# Patient Record
Sex: Female | Born: 1995
Health system: Southern US, Community
[De-identification: ages and names within clinical notes are randomized; demographics above are authoritative.]

## PROBLEM LIST (undated history)

## (undated) HISTORY — PX: OTHER SURGICAL HISTORY: SHX169

---

## 2016-03-07 DIAGNOSIS — Z13220 Encounter for screening for lipoid disorders: Secondary | ICD-10-CM | POA: Diagnosis not present

## 2016-03-07 DIAGNOSIS — Z124 Encounter for screening for malignant neoplasm of cervix: Secondary | ICD-10-CM | POA: Diagnosis not present

## 2016-03-07 DIAGNOSIS — Z0000 Encounter for general adult medical examination without abnormal findings: Secondary | ICD-10-CM | POA: Diagnosis not present

## 2016-03-07 DIAGNOSIS — Z30011 Encounter for initial prescription of contraceptive pills: Secondary | ICD-10-CM | POA: Diagnosis not present

## 2016-03-07 LAB — HM PAP SMEAR: HM Pap smear: NEGATIVE

## 2016-05-01 DIAGNOSIS — J1089 Influenza due to other identified influenza virus with other manifestations: Secondary | ICD-10-CM | POA: Diagnosis not present

## 2017-01-15 DIAGNOSIS — L02426 Furuncle of left lower limb: Secondary | ICD-10-CM | POA: Diagnosis not present

## 2017-01-15 DIAGNOSIS — L02416 Cutaneous abscess of left lower limb: Secondary | ICD-10-CM | POA: Diagnosis not present

## 2017-02-19 DIAGNOSIS — J029 Acute pharyngitis, unspecified: Secondary | ICD-10-CM | POA: Diagnosis not present

## 2017-02-19 DIAGNOSIS — J020 Streptococcal pharyngitis: Secondary | ICD-10-CM | POA: Diagnosis not present

## 2017-02-19 DIAGNOSIS — R509 Fever, unspecified: Secondary | ICD-10-CM | POA: Diagnosis not present

## 2017-03-09 ENCOUNTER — Other Ambulatory Visit: Payer: Self-pay

## 2017-03-09 DIAGNOSIS — L0202 Furuncle of face: Secondary | ICD-10-CM | POA: Diagnosis present

## 2017-03-09 DIAGNOSIS — L03211 Cellulitis of face: Principal | ICD-10-CM | POA: Diagnosis present

## 2017-03-09 DIAGNOSIS — Z8614 Personal history of Methicillin resistant Staphylococcus aureus infection: Secondary | ICD-10-CM | POA: Diagnosis not present

## 2017-03-09 DIAGNOSIS — R51 Headache: Secondary | ICD-10-CM | POA: Diagnosis not present

## 2017-03-09 DIAGNOSIS — L03811 Cellulitis of head [any part, except face]: Secondary | ICD-10-CM | POA: Diagnosis not present

## 2017-03-09 DIAGNOSIS — E876 Hypokalemia: Secondary | ICD-10-CM | POA: Diagnosis present

## 2017-03-09 DIAGNOSIS — Z881 Allergy status to other antibiotic agents status: Secondary | ICD-10-CM

## 2017-03-09 NOTE — ED Triage Notes (Signed)
Pt arrives to ED via POV from home with c/o recurrent MRSA infections. Pt was seen at [facility] earlier today, r/x'd ABX (Clindamycin 300mg  TID). Pt reports taking 3 doses total and came in d/t no improvement in s/x's.

## 2017-03-10 ENCOUNTER — Inpatient Hospital Stay
Admission: EM | Admit: 2017-03-10 | Discharge: 2017-03-11 | DRG: 603 | Disposition: A | Discharge status: Home / Self Care | Payer: BLUE CROSS/BLUE SHIELD | Attending: Internal Medicine | Admitting: Internal Medicine

## 2017-03-10 ENCOUNTER — Other Ambulatory Visit: Payer: Self-pay

## 2017-03-10 ENCOUNTER — Encounter: Payer: Self-pay | Admitting: Radiology

## 2017-03-10 ENCOUNTER — Emergency Department: Payer: BLUE CROSS/BLUE SHIELD

## 2017-03-10 DIAGNOSIS — L03211 Cellulitis of face: Secondary | ICD-10-CM | POA: Diagnosis present

## 2017-03-10 DIAGNOSIS — L03811 Cellulitis of head [any part, except face]: Secondary | ICD-10-CM | POA: Diagnosis not present

## 2017-03-10 DIAGNOSIS — Z881 Allergy status to other antibiotic agents status: Secondary | ICD-10-CM | POA: Diagnosis not present

## 2017-03-10 DIAGNOSIS — E876 Hypokalemia: Secondary | ICD-10-CM | POA: Diagnosis not present

## 2017-03-10 DIAGNOSIS — R51 Headache: Secondary | ICD-10-CM | POA: Diagnosis not present

## 2017-03-10 DIAGNOSIS — L0202 Furuncle of face: Secondary | ICD-10-CM | POA: Diagnosis present

## 2017-03-10 DIAGNOSIS — Z8614 Personal history of Methicillin resistant Staphylococcus aureus infection: Secondary | ICD-10-CM | POA: Diagnosis not present

## 2017-03-10 HISTORY — DX: Cellulitis of face: L03.211

## 2017-03-10 LAB — CBC WITH DIFFERENTIAL/PLATELET
BASOS ABS: 0.1 10*3/uL (ref 0–0.1)
BASOS PCT: 1 %
EOS PCT: 2 %
Eosinophils Absolute: 0.1 10*3/uL (ref 0–0.7)
HCT: 40.2 % (ref 35.0–47.0)
Hemoglobin: 13.6 g/dL (ref 12.0–16.0)
Lymphocytes Relative: 24 %
Lymphs Abs: 2.3 10*3/uL (ref 1.0–3.6)
MCH: 27.5 pg (ref 26.0–34.0)
MCHC: 33.7 g/dL (ref 32.0–36.0)
MCV: 81.5 fL (ref 80.0–100.0)
MONO ABS: 0.7 10*3/uL (ref 0.2–0.9)
MONOS PCT: 8 %
Neutro Abs: 6.2 10*3/uL (ref 1.4–6.5)
Neutrophils Relative %: 65 %
PLATELETS: 254 10*3/uL (ref 150–440)
RBC: 4.94 MIL/uL (ref 3.80–5.20)
RDW: 13.4 % (ref 11.5–14.5)
WBC: 9.5 10*3/uL (ref 3.6–11.0)

## 2017-03-10 LAB — CBC
HEMATOCRIT: 39.2 % (ref 35.0–47.0)
Hemoglobin: 13.2 g/dL (ref 12.0–16.0)
MCH: 27.6 pg (ref 26.0–34.0)
MCHC: 33.7 g/dL (ref 32.0–36.0)
MCV: 82.1 fL (ref 80.0–100.0)
PLATELETS: 234 10*3/uL (ref 150–440)
RBC: 4.77 MIL/uL (ref 3.80–5.20)
RDW: 13.5 % (ref 11.5–14.5)
WBC: 6.2 10*3/uL (ref 3.6–11.0)

## 2017-03-10 LAB — BASIC METABOLIC PANEL
ANION GAP: 10 (ref 5–15)
Anion gap: 5 (ref 5–15)
BUN: 11 mg/dL (ref 6–20)
BUN: 8 mg/dL (ref 6–20)
CALCIUM: 9 mg/dL (ref 8.9–10.3)
CHLORIDE: 109 mmol/L (ref 101–111)
CO2: 22 mmol/L (ref 22–32)
CO2: 23 mmol/L (ref 22–32)
CREATININE: 0.56 mg/dL (ref 0.44–1.00)
CREATININE: 0.55 mg/dL (ref 0.44–1.00)
Calcium: 8.5 mg/dL — ABNORMAL LOW (ref 8.9–10.3)
Chloride: 105 mmol/L (ref 101–111)
GFR calc Af Amer: >60 mL/min (ref >60)
GFR calc non Af Amer: >60 mL/min (ref >60)
GLUCOSE: 92 mg/dL (ref 65–99)
GLUCOSE: 94 mg/dL (ref 65–99)
POTASSIUM: 3.5 mmol/L (ref 3.5–5.1)
Potassium: 3.4 mmol/L — ABNORMAL LOW (ref 3.5–5.1)
SODIUM: 137 mmol/L (ref 135–145)
Sodium: 137 mmol/L (ref 135–145)

## 2017-03-10 MED ORDER — HYDROCODONE-ACETAMINOPHEN 5-325 MG PO TABS: 1.0000 | ORAL_TABLET | ORAL | Status: DC | PRN

## 2017-03-10 MED ORDER — SODIUM CHLORIDE 0.9 % IV BOLUS (SEPSIS)
1000.0000 mL | Freq: Once | INTRAVENOUS | Status: AC
  Administered 2017-03-10: 1000 mL via INTRAVENOUS

## 2017-03-10 MED ORDER — POTASSIUM CHLORIDE CRYS ER 20 MEQ PO TBCR
20.0000 meq | EXTENDED_RELEASE_TABLET | Freq: Once | ORAL | Status: AC
  Administered 2017-03-10: 20 meq via ORAL

## 2017-03-10 MED ORDER — VANCOMYCIN HCL IN DEXTROSE 750-5 MG/150ML-% IV SOLN
750.0000 mg | Freq: Three times a day (TID) | INTRAVENOUS | Status: DC
  Filled 2017-03-10 (×2): qty 150

## 2017-03-10 MED ORDER — VANCOMYCIN HCL IN DEXTROSE 1-5 GM/200ML-% IV SOLN
1000.0000 mg | Freq: Once | INTRAVENOUS | Status: AC
  Administered 2017-03-10: 1000 mg via INTRAVENOUS
  Filled 2017-03-10: qty 200

## 2017-03-10 MED ORDER — POTASSIUM CHLORIDE CRYS ER 20 MEQ PO TBCR
EXTENDED_RELEASE_TABLET | ORAL | Status: AC
  Filled 2017-03-10: qty 1

## 2017-03-10 MED ORDER — ACETAMINOPHEN 650 MG RE SUPP: 650.0000 mg | Freq: Four times a day (QID) | RECTAL | Status: DC | PRN

## 2017-03-10 MED ORDER — ONDANSETRON HCL 4 MG PO TABS: 4.0000 mg | ORAL_TABLET | Freq: Four times a day (QID) | ORAL | Status: DC | PRN

## 2017-03-10 MED ORDER — DIPHENHYDRAMINE HCL 50 MG/ML IJ SOLN
12.5000 mg | Freq: Once | INTRAMUSCULAR | Status: AC
  Administered 2017-03-10: 12.5 mg via INTRAVENOUS
  Filled 2017-03-10: qty 1

## 2017-03-10 MED ORDER — SENNOSIDES-DOCUSATE SODIUM 8.6-50 MG PO TABS: 1.0000 | ORAL_TABLET | Freq: Every evening | ORAL | Status: DC | PRN

## 2017-03-10 MED ORDER — VANCOMYCIN HCL IN DEXTROSE 750-5 MG/150ML-% IV SOLN
750.0000 mg | Freq: Two times a day (BID) | INTRAVENOUS | Status: DC
  Administered 2017-03-10: 750 mg via INTRAVENOUS
  Filled 2017-03-10 (×3): qty 150

## 2017-03-10 MED ORDER — VANCOMYCIN HCL IN DEXTROSE 750-5 MG/150ML-% IV SOLN
750.0000 mg | Freq: Three times a day (TID) | INTRAVENOUS | Status: DC
  Filled 2017-03-10: qty 150

## 2017-03-10 MED ORDER — DIPHENHYDRAMINE HCL 25 MG PO CAPS
25.0000 mg | ORAL_CAPSULE | Freq: Once | ORAL | Status: AC
  Administered 2017-03-10: 25 mg via ORAL
  Filled 2017-03-10: qty 1

## 2017-03-10 MED ORDER — ACETAMINOPHEN 325 MG PO TABS: 650.0000 mg | ORAL_TABLET | Freq: Four times a day (QID) | ORAL | Status: DC | PRN

## 2017-03-10 MED ORDER — SODIUM CHLORIDE 0.9 % IV SOLN
INTRAVENOUS | Status: DC
Start: 2017-03-10 — End: 2017-03-11
  Administered 2017-03-10 – 2017-03-11 (×3): via INTRAVENOUS

## 2017-03-10 MED ORDER — ONDANSETRON HCL 4 MG/2ML IJ SOLN: 4.0000 mg | Freq: Four times a day (QID) | INTRAMUSCULAR | Status: DC | PRN

## 2017-03-10 MED ORDER — POTASSIUM CHLORIDE CRYS ER 20 MEQ PO TBCR
EXTENDED_RELEASE_TABLET | ORAL | Status: AC
  Administered 2017-03-10: 20 meq via ORAL
  Filled 2017-03-10: qty 1

## 2017-03-10 MED ORDER — KETOROLAC TROMETHAMINE 30 MG/ML IJ SOLN: 30.0000 mg | Freq: Four times a day (QID) | INTRAMUSCULAR | Status: DC | PRN

## 2017-03-10 MED ORDER — DIPHENHYDRAMINE HCL 25 MG PO CAPS
25.0000 mg | ORAL_CAPSULE | Freq: Four times a day (QID) | ORAL | Status: DC | PRN
  Administered 2017-03-10 (×3): 25 mg via ORAL
  Filled 2017-03-10 (×3): qty 1

## 2017-03-10 MED ORDER — IOPAMIDOL (ISOVUE-300) INJECTION 61%
75.0000 mL | Freq: Once | INTRAVENOUS | Status: AC | PRN
  Administered 2017-03-10: 75 mL via INTRAVENOUS

## 2017-03-10 NOTE — Progress Notes (Signed)
Dr Katheren ShamsSalary came to see and examine pt. Resume Vancomycin dose for tonight and give another dose of Benadryl 25mg  PO as premedication per Dr Katheren ShamsSalary. Pt and significant other at bedside agreeable to treatment.

## 2017-03-10 NOTE — H&P (Signed)
[facility] [place] [facility]   PATIENT NAME: Cassidy Boyd    MR#:  [id number]  DATE OF BIRTH:  08/28/1995  DATE OF ADMISSION:  03/10/2017  PRIMARY CARE PHYSICIAN: Patient, No Pcp Per   REQUESTING/REFERRING PHYSICIAN:   CHIEF COMPLAINT:   Chief Complaint  Patient presents with  . Recurrent Skin Infections    HISTORY OF PRESENT ILLNESS: Cassidy Dickernna Grisso  is a 21 y.o. female with no significant past medical history presented to the emergency room with redness and swelling over the forehead. Patient had a pimple over the forehead and she squeezed it. The redness surrounding the pimple area increased in size along with swelling and induration. Patient had some aching pain which was 5 out of 10 on a scale of 1-10. She went to urgent care clinic for the above and started taking oral clindamycin. Patient completed 3 days of antibiotic. And redness increased in size. Patient was treated for MRSA skin infections in the past. She presented to the emergency room with above complaints. No fever and chills. No itching or any drainage from the lesions. She was worked up with CT maxillofacial area which showed frontal cellulitis.  PAST MEDICAL HISTORY:  History reviewed. No pertinent past medical history.  PAST SURGICAL HISTORY:  Past Surgical History:  Procedure Laterality Date  . none      SOCIAL HISTORY:  Social History   Tobacco Use  . Smoking status: Never Smoker  . Smokeless tobacco: Never Used  Substance Use Topics  . Alcohol use: Yes    FAMILY HISTORY:  Family History  Problem Relation Age of Onset  . Diabetes Mellitus II Maternal Grandmother   . Diabetes Mellitus II Maternal Grandfather     DRUG ALLERGIES:  Allergies  Allergen Reactions  . Bactrim [Sulfamethoxazole-Trimethoprim] Rash    REVIEW OF SYSTEMS:   CONSTITUTIONAL: No fever, fatigue or weakness.  EYES: No blurred or double vision.  EARS, NOSE, AND THROAT: No tinnitus or ear pain.   RESPIRATORY: No cough, shortness of breath, wheezing or hemoptysis.  CARDIOVASCULAR: No chest pain, orthopnea, edema.  GASTROINTESTINAL: No nausea, vomiting, diarrhea or abdominal pain.  GENITOURINARY: No dysuria, hematuria.  ENDOCRINE: No polyuria, nocturia,  HEMATOLOGY: No anemia, easy bruising or bleeding SKIN: Has redness of skin over fore head with swelling and induration MUSCULOSKELETAL: No joint pain or arthritis.   NEUROLOGIC: No tingling, numbness, weakness.  PSYCHIATRY: No anxiety or depression.   MEDICATIONS AT HOME:  Prior to Admission medications   Medication Sig Start Date End Date Taking? Authorizing Provider  clindamycin (CLEOCIN) 300 MG capsule Take 1 capsule by mouth 3 (three) times daily. 03/09/17 03/19/17 Yes [provider]  mupirocin nasal ointment (BACTROBAN NASAL) 2 % Place 1 application into the nose 2 (two) times daily. 03/09/17 03/16/17 Yes [provider]  Norgestimate-Ethinyl Estradiol Triphasic (TRI-SPRINTEC) 0.18/0.215/0.25 MG-35 MCG tablet Take 1 tablet by mouth daily. 02/11/17  Yes [provider]      PHYSICAL EXAMINATION:   VITAL SIGNS: Blood pressure 130/81, pulse 91, temperature 97.9 F (36.6 C), temperature source Oral, resp. rate 17, height 5\' 6"  (1.676 m), weight 52.2 kg (115 lb), last menstrual period 03/09/2017, SpO2 100 %.  GENERAL:  21 y.o.-year-old patient lying in the bed with no acute distress.  EYES: Pupils equal, round, reactive to light and accommodation. No scleral icterus. Extraocular muscles intact.  HEENT: Head atraumatic, normocephalic. Oropharynx and nasopharynx clear.  Swelling and redness over fore head at the superior nasal bridge.  NECK:  Supple, no jugular venous distention. No thyroid enlargement, no tenderness.  LUNGS: Normal breath sounds bilaterally, no wheezing, rales,rhonchi or crepitation. No use of accessory muscles of respiration.  CARDIOVASCULAR: S1, S2 normal. No murmurs, rubs, or gallops.   ABDOMEN: Soft, nontender, nondistended. Bowel sounds present. No organomegaly or mass.  EXTREMITIES: No pedal edema, cyanosis, or clubbing.  NEUROLOGIC: Cranial nerves II through XII are intact. Muscle strength 5/5 in all extremities. Sensation intact. Gait not checked.  PSYCHIATRIC: The patient is alert and oriented x 3.  SKIN: Redness of skin with swelling over fore head.  LABORATORY PANEL:   CBC Recent Labs  Lab 03/10/17 0254  WBC 9.5  HGB 13.6  HCT 40.2  PLT 254  MCV 81.5  MCH 27.5  MCHC 33.7  RDW 13.4  LYMPHSABS 2.3  MONOABS 0.7  EOSABS 0.1  BASOSABS 0.1   ------------------------------------------------------------------------------------------------------------------  Chemistries  Recent Labs  Lab 03/10/17 0254  NA 137  K 3.4*  CL 105  CO2 22  GLUCOSE 92  BUN 11  CREATININE 0.56  CALCIUM 9.0   ------------------------------------------------------------------------------------------------------------------ estimated creatinine clearance is 90.9 mL/min (by C-G formula based on SCr of 0.56 mg/dL). ------------------------------------------------------------------------------------------------------------------ No results for input(s): TSH, T4TOTAL, T3FREE, THYROIDAB in the last 72 hours.  Invalid input(s): FREET3   Coagulation profile No results for input(s): INR, PROTIME in the last 168 hours. ------------------------------------------------------------------------------------------------------------------- No results for input(s): DDIMER in the last 72 hours. -------------------------------------------------------------------------------------------------------------------  Cardiac Enzymes No results for input(s): CKMB, TROPONINI, MYOGLOBIN in the last 168 hours.  Invalid input(s): CK ------------------------------------------------------------------------------------------------------------------ Invalid input(s):  POCBNP  ---------------------------------------------------------------------------------------------------------------  Urinalysis No results found for: COLORURINE, APPEARANCEUR, LABSPEC, PHURINE, GLUCOSEU, HGBUR, BILIRUBINUR, KETONESUR, PROTEINUR, UROBILINOGEN, NITRITE, LEUKOCYTESUR   RADIOLOGY: Ct Maxillofacial W Contrast  Result Date: 03/10/2017 CLINICAL DATA:  Facial infection EXAM: CT MAXILLOFACIAL WITH CONTRAST TECHNIQUE: Multidetector CT imaging of the maxillofacial structures was performed with intravenous contrast. Multiplanar CT image reconstructions were also generated. CONTRAST:  75mL ISOVUE-300 IOPAMIDOL (ISOVUE-300) INJECTION 61% COMPARISON:  None. FINDINGS: Osseous:  No facial fracture or other acute osseous abnormality. Orbits: The globes are intact. Normal appearance of the intra- and extraconal fat. Symmetric extraocular muscles. Sinuses: No fluid levels or advanced mucosal thickening. Soft tissues: There is thickening and increased density of the soft tissues of the forehead extending inferiorly to the superior nasal bridge. There is no focal fluid collection. No abnormality of the underlying sinus. No extension to the postseptal orbit. Limited intracranial: Normal. IMPRESSION: Frontal soft tissue cellulitis extending to the superior nasal bridge without abscess or fluid collection. No extension to the orbit. No underlying sinusitis or other likely source. Electronically Signed   By: Deatra RobinsonKevin  Herman M.D.   On: 03/10/2017 04:08    EKG: No orders found for this or any previous visit.  IMPRESSION AND PLAN: 21 year old female patient presented to the emergency room with swelling, redness over the forehead at the superior nasal bridge after squeezing a pimple.  Admitting diagnosis 1. Frontal facial cellulitis 2. Hypokalemia 3. Facial pain 4. History of MRSA skin infection Treatment plan Admit patient to medical floor Replace potassium Start patient on IV vancomycin  antibiotic Follow-up cultures Pain management with IV ketorolac and oral Percocet.  All the records are reviewed and case discussed with ED provider. Management plans discussed with the patient, family and they are in agreement.  CODE STATUS:FULL CODE Code Status History    This patient does not have a recorded code status. Please follow your organizational policy for patients in this  situation.       TOTAL TIME TAKING CARE OF THIS PATIENT: 50 minutes.    Ihor Austin M.D on 03/10/2017 at 4:52 AM  Between 7am to 6pm - Pager - [phone]  After 6pm go to [email or web address] - password [email or web address]  [facility]  Office  [phone]  CC: Primary care physician; Patient, No Pcp Per

## 2017-03-10 NOTE — Progress Notes (Addendum)
MD called for increased L orbital redness and swelling. RN instructed not to give vancomycin and to administer benadryl. Will continue to monitor.

## 2017-03-10 NOTE — Progress Notes (Signed)
Pt in no acute distress. VSS. Pt and mother feel that swelling has increased since 0500. Will continue to monitor.

## 2017-03-10 NOTE — Progress Notes (Signed)
Per report by morning shift RN Irving Burtonmily, pt had increased swelling and redness of the left eye after administration of Vancomycin IV despite being premedicated with Benadryl. MD was aware and instructed RN Irving Burtonmily to not give Vancomycin anymore. Pt has due Vancomycin IV dose at 2200, however pt was hesitant about the treatment and 'fearful' about having the same reaction that she had during the day. Dr. Katheren ShamsSalary paged and made aware about this, said to hold Vancomycin dose for the meantime. Nursing continues to monitor.

## 2017-03-10 NOTE — ED Provider Notes (Signed)
[facility] Emergency Department Provider Note   ____________________________________________   First MD Initiated Contact with Patient 03/10/17 [phone]     (approximate)  I have reviewed the triage vital signs and the nursing notes.   HISTORY  Chief Complaint Recurrent Skin Infections    HPI Cassidy Boyd is a 21 y.o. female who presents to the ED from home with a chief complaint of facial infection.  Patient reports recurrent MRSA infections in her legs in October of this year.  She was treated with antibiotics, wounds were cultured and confirmed MRSA.  Reports trying to pop a pimple on her forehead 2 days ago.  Went to urgent care yesterday for redness and swelling of her forehead.  She was placed on clindamycin and has taken 3 doses with increasing redness and swelling which is now radiating into her left nasal bridge.  Denies associated fever, chills, chest pain, shortness of breath, abdominal pain, nausea, vomiting.  Denies recent travel or trauma.   Past Medical History  None  There are no active problems to display for this patient.   History reviewed. No pertinent surgical history.  Prior to Admission medications   Not on File    Allergies Bactrim [sulfamethoxazole-trimethoprim]  No family history on file.  Social History Social History   Tobacco Use  . Smoking status: Never Smoker  . Smokeless tobacco: Never Used  Substance Use Topics  . Alcohol use: Yes  . Drug use: No    Review of Systems  Constitutional: No fever/chills. Eyes: No visual changes. ENT: No sore throat. Cardiovascular: Denies chest pain. Respiratory: Denies shortness of breath. Gastrointestinal: No abdominal pain.  No nausea, no vomiting.  No diarrhea.  No constipation. Genitourinary: Negative for dysuria. Musculoskeletal: Negative for back pain. Skin: Positive for redness and swelling to forehead.  Negative for rash. Neurological: Negative for headaches,  focal weakness or numbness.   ____________________________________________   PHYSICAL EXAM:  VITAL SIGNS: ED Triage Vitals  Enc Vitals Group     BP 03/09/17 2253 (!) 143/92     Pulse Rate 03/09/17 2253 92     Resp 03/09/17 2253 17     Temp 03/09/17 2253 97.9 F (36.6 C)     Temp Source 03/09/17 2253 Oral     SpO2 03/09/17 2253 100 %     Weight 03/09/17 2250 115 lb (52.2 kg)     Height 03/09/17 2250 5\' 6"  (1.676 m)     Head Circumference --      Peak Flow --      Pain Score 03/09/17 2250 7     Pain Loc --      Pain Edu? --      Excl. in GC? --     Constitutional: Alert and oriented. Well appearing and in no acute distress. Eyes: Conjunctivae are normal. PERRL. EOMI. Head: Small scab to central forehead surrounding erythema and swelling which extends into the left nasal bridge. Nose: Mild swelling to the left nasal bridge. Mouth/Throat: Mucous membranes are moist.  Oropharynx non-erythematous. Neck: No stridor.   Cardiovascular: Normal rate, regular rhythm. Grossly normal heart sounds.  Good peripheral circulation. Respiratory: Normal respiratory effort.  No retractions. Lungs CTAB. Gastrointestinal: Soft and nontender. No distention. No abdominal bruits. No CVA tenderness. Musculoskeletal: No lower extremity tenderness nor edema.  No joint effusions. Neurologic:  Normal speech and language. No gross focal neurologic deficits are appreciated. No gait instability. Skin:  Skin is warm, dry and intact. No rash noted. Psychiatric:  Mood and affect are normal. Speech and behavior are normal.  ____________________________________________   LABS (all labs ordered are listed, but only abnormal results are displayed)  Labs Reviewed  BASIC METABOLIC PANEL - Abnormal; Notable for the following components:      Result Value   Potassium 3.4 (*)    All other components within normal limits  CBC WITH DIFFERENTIAL/PLATELET    ____________________________________________  EKG  None ____________________________________________  RADIOLOGY  Ct Maxillofacial W Contrast  Result Date: 03/10/2017 CLINICAL DATA:  Facial infection EXAM: CT MAXILLOFACIAL WITH CONTRAST TECHNIQUE: Multidetector CT imaging of the maxillofacial structures was performed with intravenous contrast. Multiplanar CT image reconstructions were also generated. CONTRAST:  75mL ISOVUE-300 IOPAMIDOL (ISOVUE-300) INJECTION 61% COMPARISON:  None. FINDINGS: Osseous:  No facial fracture or other acute osseous abnormality. Orbits: The globes are intact. Normal appearance of the intra- and extraconal fat. Symmetric extraocular muscles. Sinuses: No fluid levels or advanced mucosal thickening. Soft tissues: There is thickening and increased density of the soft tissues of the forehead extending inferiorly to the superior nasal bridge. There is no focal fluid collection. No abnormality of the underlying sinus. No extension to the postseptal orbit. Limited intracranial: Normal. IMPRESSION: Frontal soft tissue cellulitis extending to the superior nasal bridge without abscess or fluid collection. No extension to the orbit. No underlying sinusitis or other likely source. Electronically Signed   By: Deatra RobinsonKevin  Herman M.D.   On: 03/10/2017 04:08    ____________________________________________   PROCEDURES  Procedure(s) performed: None  Procedures  Critical Care performed: No  ____________________________________________   INITIAL IMPRESSION / ASSESSMENT AND PLAN / ED COURSE  As part of my medical decision making, I reviewed the following data within the electronic MEDICAL RECORD NUMBER Nursing notes reviewed and incorporated, Labs reviewed, Radiograph reviewed and Notes from prior ED visits, discussion with Dr. Tobi BastosPyreddy.   21 year old female with a history of recurrent MRSA infections presenting with facial cellulitis, questionable abscess.  Has taken 3 doses of  clindamycin.  Differential diagnosis includes but is not limited to cellulitis, abscess, bacteremia.  Will obtain screening lab work, start IV vancomycin and obtain CT maxillofacial to further evaluate area of infection.  Clinical Course as of Mar 10 416  Wynelle LinkSun Mar 10, 2017  [id number] Updated patient of laboratory and CT imaging results. Will discuss with hospitalist to evaluate patient in the emergency department for admission for facial cellulitis.  [JS]    Clinical Course User Index [JS] Irean HongSung, Manisha Cancel J, MD     ____________________________________________   FINAL CLINICAL IMPRESSION(S) / ED DIAGNOSES  Final diagnoses:  Facial cellulitis     ED Discharge Orders    None       Note:  This document was prepared using Dragon voice recognition software and may include unintentional dictation errors.    Irean HongSung, Adem Costlow J, MD 03/10/17 [phone]

## 2017-03-10 NOTE — Progress Notes (Signed)
21 y.o. female with no significant past medical history presented to the emergency room with redness and swelling over the forehead. Patient had a pimple over the forehead and she squeezed it. The redness surrounding the pimple area increased in size along with swelling and induration. Patient had some aching pain which was 5 out of 10 on a scale of 1-10. She went to urgent care clinic for the above and started taking oral clindamycin. Patient completed 3 days of antibiotic. And redness increased in size. Patient was treated for MRSA skin infections in the past. She presented to the emergency room with above complaints. No fever and chills. No itching or any drainage from the lesions. She was worked up with CT maxillofacial area which showed frontal cellulitis  1. Frontal facial cellulitis 2. Hypokalemia 3. Facial pain 4. History of MRSA skin infection  Treatment plan -Continue IV vancomycin antibiotic Follow-up cultures Pain management with IV ketorolac and oral Percocet.    Likely discharge tomorrow on oral clindamycin.  He had a long discussion with patient and her mother at bedside.  Time spent: 25 minutes

## 2017-03-10 NOTE — Progress Notes (Signed)
Pharmacy Antibiotic Note  Cassidy Boyd is a 21 y.o. female admitted on 03/10/2017 with cellulitis.  Pharmacy has been consulted for vancomycin dosing.  Plan: Patient received vanc 1g IV x 1 in ED  Will continue w/ vanc 750 mg IV q8h w/ 8 hour stack dose. Will draw vanc trough 11/26 @ 1000 prior to 4th dose. Patient also had some sensitivity reactions to vanc will let future doses of vanc run at 1.5 hrs and will put note to pre-medicate.  Ke 0.0798 T1/2 8.6 hrs Goal trough 15 - 20 mcg/mL  Height: 5\' 6"  (167.6 cm) Weight: 115 lb (52.2 kg) IBW/kg (Calculated) : 59.3  Temp (24hrs), Avg:97.9 F (36.6 C), Min:97.9 F (36.6 C), Max:97.9 F (36.6 C)  Recent Labs  Lab 03/10/17 0254  WBC 9.5  CREATININE 0.56    Estimated Creatinine Clearance: 90.9 mL/min (by C-G formula based on SCr of 0.56 mg/dL).    Allergies  Allergen Reactions  . Bactrim [Sulfamethoxazole-Trimethoprim] Rash    Thank you for allowing pharmacy to be a part of this patient's care.  Thomasene Rippleavid Jamicia Haaland, PharmD, BCPS Clinical Pharmacist 03/10/2017

## 2017-03-10 NOTE — Progress Notes (Signed)
Pharmacy Antibiotic Note  Cassidy Boyd is a 21 y.o. female admitted on 03/10/2017 with cellulitis.  Pharmacy has been consulted for vancomycin dosing.  Plan: Patient received vanc 1g IV x 1 in ED  Will order vancomycin 750 mg IV q12h VT goal 10-15 mcg/mL  Kinetics: Using actual body weight = 48 kg Ke 0.087 T1/2 8 hrs Cmin (calculated) ~12.5 mcg/mL  Height: 5\' 6"  (167.6 cm) Weight: 105 lb 6.4 oz (47.8 kg) IBW/kg (Calculated) : 59.3  Temp (24hrs), Avg:98.2 F (36.8 C), Min:97.9 F (36.6 C), Max:98.8 F (37.1 C)  Recent Labs  Lab 03/10/17 0254 03/10/17 0538  WBC 9.5 6.2  CREATININE 0.56 0.55    Estimated Creatinine Clearance: 83.2 mL/min (by C-G formula based on SCr of 0.55 mg/dL).    Allergies  Allergen Reactions  . Bactrim [Sulfamethoxazole-Trimethoprim] Rash    Thank you for allowing pharmacy to be a part of this patient's care.  Cindi CarbonMary M Otoniel Myhand, PharmD, BCPS Clinical Pharmacist 03/10/2017

## 2017-03-10 NOTE — ED Notes (Signed)
CT notified IV is in place and blood work sent off.

## 2017-03-10 NOTE — ED Notes (Signed)
Pt reports some itching to head, face, and neck during Vancomycin infusion. Pt denies SHOB, trouble swallowing or breathing, or swelling to throat. Dr Dolores FrameSung made aware; will also notify floor RN and pharmacy to infuse future doses at slower rates.

## 2017-03-11 MED ORDER — LINEZOLID 600 MG PO TABS: 600.0000 mg | ORAL_TABLET | Freq: Two times a day (BID) | ORAL | 0 refills | Status: DC

## 2017-03-11 MED ORDER — LINEZOLID 600 MG/300ML IV SOLN
600.0000 mg | Freq: Two times a day (BID) | INTRAVENOUS | Status: DC
  Administered 2017-03-11: 600 mg via INTRAVENOUS
  Filled 2017-03-11 (×2): qty 300

## 2017-03-11 NOTE — Consult Note (Signed)
[facility] Infectious Disease     Reason for Consult:Facial cellulitis   Referring Physician: Carlynn Spry Date of Admission:  03/10/2017   Active Problems:   Cellulitis diffuse, face   HPI: [name] is a 21 y.o. female admitted with facial redness and swelling after she squeezed a pimple on her forehead.  She had been started on otpt clinda for 3 doses without improvement.  On admit no fevers, nml wbc. CT showed facial cellulitis but no abscess. Started on IV vanco but had some itching with it.    History reviewed. No pertinent past medical history. Past Surgical History:  Procedure Laterality Date  . none     Social History   Tobacco Use  . Smoking status: Never Smoker  . Smokeless tobacco: Never Used  Substance Use Topics  . Alcohol use: Yes  . Drug use: No   Family History  Problem Relation Age of Onset  . Diabetes Mellitus II Maternal Grandmother   . Diabetes Mellitus II Maternal Grandfather     Allergies:  Allergies  Allergen Reactions  . Bactrim [Sulfamethoxazole-Trimethoprim] Rash    Current antibiotics: Antibiotics Given (last 72 hours)    Date/Time Action Medication Dose Rate   03/10/17 0256 New Bag/Given   vancomycin (VANCOCIN) IVPB 1000 mg/200 mL premix 1,000 mg 200 mL/hr   03/10/17 1148 New Bag/Given   vancomycin (VANCOCIN) IVPB 750 mg/150 ml premix 750 mg 100 mL/hr   03/11/17 0853 New Bag/Given   linezolid (ZYVOX) IVPB 600 mg 600 mg 300 mL/hr      MEDICATIONS:   Review of Systems - 11 systems reviewed and negative per HPI   OBJECTIVE: Temp:  [97.8 F (36.6 C)-98.6 F (37 C)] 98.4 F (36.9 C) (11/26 0743) Pulse Rate:  [64-102] 64 (11/26 0743) Resp:  [15-18] 16 (11/26 0743) BP: (103-134)/(64-78) 116/71 (11/26 0743) SpO2:  [99 %-100 %] 99 % (11/26 0743) Physical Exam  Constitutional:  oriented to person, place, and time. appears well-developed and well-nourished. No distress.  HENT: [place]/AT, PERRLA, no scleral icterus Mouth/Throat:  Oropharynx is clear and moist. No oropharyngeal exudate.  Cardiovascular: Normal rate, regular rhythm and normal heart sounds. Pulmonary/Chest: Effort normal and breath sounds normal. No respiratory distress.  has no wheezes.  Neck = supple, no nuchal rigidity Abdominal: Soft. Bowel sounds are normal.  exhibits no distension. There is no tenderness.  Lymphadenopathy: no cervical adenopathy. No axillary adenopathy Neurological: alert and oriented to person, place, and time.  Skin: middle of forehead with a 1 cm area of induration, small scab on the site. Mild surrounding redness. Mild ttp Psychiatric: a normal mood and affect.  behavior is normal.    LABS: Results for orders placed or performed during the hospital encounter of 03/10/17 (from the past 48 hour(s))  CBC with Differential     Status: None   Collection Time: 03/10/17  2:54 AM  Result Value Ref Range   WBC 9.5 3.6 - 11.0 K/uL   RBC 4.94 3.80 - 5.20 MIL/uL   Hemoglobin 13.6 12.0 - 16.0 g/dL   HCT 40.2 35.0 - 47.0 %   MCV 81.5 80.0 - 100.0 fL   MCH 27.5 26.0 - 34.0 pg   MCHC 33.7 32.0 - 36.0 g/dL   RDW 13.4 11.5 - 14.5 %   Platelets 254 150 - 440 K/uL   Neutrophils Relative % 65 %   Neutro Abs 6.2 1.4 - 6.5 K/uL   Lymphocytes Relative 24 %   Lymphs Abs 2.3 1.0 - 3.6  K/uL   Monocytes Relative 8 %   Monocytes Absolute 0.7 0.2 - 0.9 K/uL   Eosinophils Relative 2 %   Eosinophils Absolute 0.1 0 - 0.7 K/uL   Basophils Relative 1 %   Basophils Absolute 0.1 0 - 0.1 K/uL  Basic metabolic panel     Status: Abnormal   Collection Time: 03/10/17  2:54 AM  Result Value Ref Range   Sodium 137 135 - 145 mmol/L   Potassium 3.4 (L) 3.5 - 5.1 mmol/L   Chloride 105 101 - 111 mmol/L   CO2 22 22 - 32 mmol/L   Glucose, Bld 92 65 - 99 mg/dL   BUN 11 6 - 20 mg/dL   Creatinine, Ser 0.56 0.44 - 1.00 mg/dL   Calcium 9.0 8.9 - 10.3 mg/dL   GFR calc non Af Amer >60 >60 mL/min   GFR calc Af Amer >60 >60 mL/min    Comment: (NOTE) The eGFR  has been calculated using the CKD EPI equation. This calculation has not been validated in all clinical situations. eGFR's persistently <60 mL/min signify possible Chronic Kidney Disease.    Anion gap 10 5 - 15  Basic metabolic panel     Status: Abnormal   Collection Time: 03/10/17  5:38 AM  Result Value Ref Range   Sodium 137 135 - 145 mmol/L   Potassium 3.5 3.5 - 5.1 mmol/L   Chloride 109 101 - 111 mmol/L   CO2 23 22 - 32 mmol/L   Glucose, Bld 94 65 - 99 mg/dL   BUN 8 6 - 20 mg/dL   Creatinine, Ser 0.55 0.44 - 1.00 mg/dL   Calcium 8.5 (L) 8.9 - 10.3 mg/dL   GFR calc non Af Amer >60 >60 mL/min   GFR calc Af Amer >60 >60 mL/min    Comment: (NOTE) The eGFR has been calculated using the CKD EPI equation. This calculation has not been validated in all clinical situations. eGFR's persistently <60 mL/min signify possible Chronic Kidney Disease.    Anion gap 5 5 - 15  CBC     Status: None   Collection Time: 03/10/17  5:38 AM  Result Value Ref Range   WBC 6.2 3.6 - 11.0 K/uL   RBC 4.77 3.80 - 5.20 MIL/uL   Hemoglobin 13.2 12.0 - 16.0 g/dL   HCT 39.2 35.0 - 47.0 %   MCV 82.1 80.0 - 100.0 fL   MCH 27.6 26.0 - 34.0 pg   MCHC 33.7 32.0 - 36.0 g/dL   RDW 13.5 11.5 - 14.5 %   Platelets 234 150 - 440 K/uL   No components found for: ESR, C REACTIVE PROTEIN MICRO: No results found for this or any previous visit (from the past 720 hour(s)).  IMAGING: Ct Maxillofacial W Contrast  Result Date: 03/10/2017 CLINICAL DATA:  Facial infection EXAM: CT MAXILLOFACIAL WITH CONTRAST TECHNIQUE: Multidetector CT imaging of the maxillofacial structures was performed with intravenous contrast. Multiplanar CT image reconstructions were also generated. CONTRAST:  47m ISOVUE-300 IOPAMIDOL (ISOVUE-300) INJECTION 61% COMPARISON:  None. FINDINGS: Osseous:  No facial fracture or other acute osseous abnormality. Orbits: The globes are intact. Normal appearance of the intra- and extraconal fat. Symmetric  extraocular muscles. Sinuses: No fluid levels or advanced mucosal thickening. Soft tissues: There is thickening and increased density of the soft tissues of the forehead extending inferiorly to the superior nasal bridge. There is no focal fluid collection. No abnormality of the underlying sinus. No extension to the postseptal orbit. Limited intracranial: Normal. IMPRESSION:  Frontal soft tissue cellulitis extending to the superior nasal bridge without abscess or fluid collection. No extension to the orbit. No underlying sinusitis or other likely source. Electronically Signed   By: Ulyses Jarred M.D.   On: 03/10/2017 04:08    Assessment:   Cassidy Boyd is a 21 y.o. female with Forehead boil and mild surrounding cellulitis. No fevers, chills. Nml wbc.  Had itching with vancomycin.  I have cultured the site but will not have results for a few days.   Recommendations Would dc when stable on 7 days total of zyvox I can see next week in follow up.  Thank you very much for allowing me to participate in the care of this patient. Please call with questions.   Cheral Marker. Ola Spurr, MD

## 2017-03-11 NOTE — Care Management (Signed)
Cost of Zyvox is $56.45 at [facility] [address] They can not fill medication until Friday due to having to order medication. Spoke with patient and she states she also spoke with [facility] and but has found a local [facility] pharmacy that can fill the medication.

## 2017-03-11 NOTE — Discharge Instructions (Signed)

## 2017-03-11 NOTE — Progress Notes (Signed)
[employer] [facility] [place] at [facility]        Cassidy Boyd was admitted to the Hospital on 03/10/2017 and Discharged  03/11/2017 and should be excused from work  for 2 days starting 03/10/2017 , may return to work without any restrictions on 03/12/2017.  Delfino LovettVipul Oron Westrup M.D on 03/11/2017,at 3:15 PM  [employer] -  at [facility]    Office  [phone]

## 2017-03-11 NOTE — Progress Notes (Signed)
1        [employer] - [place] at [facility]   PATIENT NAME: Cassidy Boyd    MR#:  [id number]  DATE OF BIRTH:  02/23/1996  SUBJECTIVE:  CHIEF COMPLAINT:   Chief Complaint  Patient presents with  . Recurrent Skin Infections  Had some concern for increasing left facial swelling and some vancomycin reaction with itching, she is not feeling well yet REVIEW OF SYSTEMS:  Review of Systems  Constitutional: Negative for chills, fever and weight loss.  HENT: Negative for nosebleeds and sore throat.   Eyes: Negative for blurred vision.  Respiratory: Negative for cough, shortness of breath and wheezing.   Cardiovascular: Negative for chest pain, orthopnea, leg swelling and PND.  Gastrointestinal: Negative for abdominal pain, constipation, diarrhea, heartburn, nausea and vomiting.  Genitourinary: Negative for dysuria and urgency.  Musculoskeletal: Negative for back pain.  Skin: Positive for itching and rash.  Neurological: Negative for dizziness, speech change, focal weakness and headaches.  Endo/Heme/Allergies: Does not bruise/bleed easily.  Psychiatric/Behavioral: Negative for depression.    DRUG ALLERGIES:   Allergies  Allergen Reactions  . Bactrim [Sulfamethoxazole-Trimethoprim] Rash   VITALS:  Blood pressure 116/71, pulse 64, temperature 98.4 F (36.9 C), temperature source Oral, resp. rate 16, height 5\' 6"  (1.676 m), weight 47.8 kg (105 lb 6.4 oz), last menstrual period 03/09/2017, SpO2 99 %. PHYSICAL EXAMINATION:  Physical Exam  Constitutional: She is oriented to person, place, and time and well-developed, well-nourished, and in no distress.  HENT:  Head: Normocephalic and atraumatic. Head is with left periorbital erythema.  Small scab to central forehead surrounding erythema and swelling which extends into the left nasal bridge  Eyes: Conjunctivae and EOM are normal. Pupils are equal, round, and reactive to light.  Neck: Normal range of motion. Neck supple.  No tracheal deviation present. No thyromegaly present.  Cardiovascular: Normal rate, regular rhythm and normal heart sounds.  Pulmonary/Chest: Effort normal and breath sounds normal. No respiratory distress. She has no wheezes. She exhibits no tenderness.  Abdominal: Soft. Bowel sounds are normal. She exhibits no distension. There is no tenderness.  Musculoskeletal: Normal range of motion.  Neurological: She is alert and oriented to person, place, and time. No cranial nerve deficit.  Skin: Skin is warm and dry. No rash noted.  Psychiatric: Mood and affect normal.   LABORATORY PANEL:  Female CBC Recent Labs  Lab 03/10/17 0538  WBC 6.2  HGB 13.2  HCT 39.2  PLT 234   ------------------------------------------------------------------------------------------------------------------ Chemistries  Recent Labs  Lab 03/10/17 0538  NA 137  K 3.5  CL 109  CO2 23  GLUCOSE 94  BUN 8  CREATININE 0.55  CALCIUM 8.5*   RADIOLOGY:  No results found. ASSESSMENT AND PLAN:  21 year old female being admitted for facial cellulitis with a history of MRSA  * Lt facial cellulitis: We will change from IV vancomycin to Zyvox as there is some concern of possible local reaction and or not much clinical improvement. -Case discussed with Dr. Clydie Braunavid Fitzgerald who will see her later today. -Maxillo facial CT scan showed Frontal soft tissue cellulitis extending to the superior nasal bridge without abscess or fluid collection. No extension to the orbit. No underlying sinusitis or other likely source  * Hypokalemia: Repleted and resolved  * Facial pain: Due to cellulitis, pain meds as needed  * History of MRSA skin infection -IV Zyvox for now     All the records are reviewed and case discussed with Care Management/Social Worker. Management  plans discussed with the patient, family (husband at bedside) and they are in agreement.  CODE STATUS: Full Code  TOTAL TIME TAKING CARE OF THIS PATIENT: 25  minutes.   More than 50% of the time was spent in counseling/coordination of care: YES  POSSIBLE D/C IN 1 DAYS, DEPENDING ON CLINICAL CONDITION.  Infectious disease evaluation   Delfino LovettVipul Kaelea Gathright M.D on 03/11/2017 at 7:58 AM  Between 7am to 6pm - Pager - [phone]  After 6pm go to [email or web address] - Social research officer, governmentpassword EPAS [email or web address]  [facility] [place] [facility]  Office  [phone]  CC: Primary care physician; Patient, No Pcp Per  Note: This dictation was prepared with Dragon dictation along with smaller phrase technology. Any transcriptional errors that result from this process are unintentional.

## 2017-03-11 NOTE — Progress Notes (Signed)
Pt educated on discharge materials and given one Rx. Pt verbalized understanding. Pt is discharging with boyfriend.

## 2017-03-11 NOTE — Progress Notes (Signed)
Pt was able to tolerate Vancomycin given IV at a slower rate however, noticeable swelling and redness on the left periorbital area noted after administration. Pt denies itching and shortness of breath. Will continue to monitor.

## 2017-03-12 LAB — HIV ANTIBODY (ROUTINE TESTING W REFLEX): HIV SCREEN 4TH GENERATION: NONREACTIVE

## 2017-03-12 NOTE — Discharge Summary (Signed)
[employer] -  at [facility]   PATIENT NAME: Cassidy Boyd    MR#:  [id number]  DATE OF BIRTH:  02/16/1996  DATE OF ADMISSION:  03/10/2017   ADMITTING PHYSICIAN: Ihor AustinPavan Pyreddy, MD  DATE OF DISCHARGE: 03/11/2017  3:15 PM  PRIMARY CARE PHYSICIAN: Patient, No Pcp Per   ADMISSION DIAGNOSIS:  Facial cellulitis [L03.211] DISCHARGE DIAGNOSIS:  Active Problems:   Cellulitis diffuse, face  SECONDARY DIAGNOSIS:  History reviewed. No pertinent past medical history. HOSPITAL COURSE:  21 year old female being admitted for facial cellulitis with a history of MRSA  * Lt facial cellulitis: improving on Zyvox  -Maxillo facial CT scan showed Frontal soft tissue cellulitis extending to the superior nasal bridge without abscess or fluid collection. No extension to the orbit. No underlying sinusitis or other likely source  *Hypokalemia: Repleted and resolved  * Facial pain: Due to cellulitis, pain meds as needed  *History of MRSA skin infection - Zyvox for now DISCHARGE CONDITIONS:  stable CONSULTS OBTAINED:  Treatment Team:  Mick SellFitzgerald, David P, MD DRUG ALLERGIES:   Allergies  Allergen Reactions  . Bactrim [Sulfamethoxazole-Trimethoprim] Rash   DISCHARGE MEDICATIONS:   Allergies as of 03/11/2017      Reactions   Bactrim [sulfamethoxazole-trimethoprim] Rash      Medication List    STOP taking these medications   CLEOCIN 300 MG capsule Generic drug:  clindamycin     TAKE these medications   BACTROBAN NASAL 2 % Generic drug:  mupirocin nasal ointment Place 1 application into the nose 2 (two) times daily.   linezolid 600 MG tablet Commonly known as:  ZYVOX Take 1 tablet (600 mg total) by mouth 2 (two) times daily.   TRI-SPRINTEC 0.18/0.215/0.25 MG-35 MCG tablet Generic drug:  Norgestimate-Ethinyl Estradiol Triphasic Take 1 tablet by mouth daily.        DISCHARGE INSTRUCTIONS:   DIET:  Regular diet DISCHARGE CONDITION:   Good ACTIVITY:  Activity as tolerated OXYGEN:  Home Oxygen: No.  Oxygen Delivery: room air DISCHARGE LOCATION:  home   If you experience worsening of your admission symptoms, develop shortness of breath, life threatening emergency, suicidal or homicidal thoughts you must seek medical attention immediately by calling 911 or calling your MD immediately  if symptoms less severe.  You Must read complete instructions/literature along with all the possible adverse reactions/side effects for all the Medicines you take and that have been prescribed to you. Take any new Medicines after you have completely understood and accpet all the possible adverse reactions/side effects.   Please note  You were cared for by a hospitalist during your hospital stay. If you have any questions about your discharge medications or the care you received while you were in the hospital after you are discharged, you can call the unit and asked to speak with the hospitalist on call if the hospitalist that took care of you is not available. Once you are discharged, your primary care physician will handle any further medical issues. Please note that NO REFILLS for any discharge medications will be authorized once you are discharged, as it is imperative that you return to your primary care physician (or establish a relationship with a primary care physician if you do not have one) for your aftercare needs so that they can reassess your need for medications and monitor your lab values.    On the day of Discharge:  VITAL SIGNS:  Blood pressure 126/70, pulse 81, temperature 98.1 F (36.7 C), temperature source Oral, resp. rate  18, height 5\' 6"  (1.676 m), weight 47.8 kg (105 lb 6.4 oz), last menstrual period 03/09/2017, SpO2 100 %. PHYSICAL EXAMINATION:  GENERAL:  21 y.o.-year-old patient lying in the bed with no acute distress.  EYES: Pupils equal, round, reactive to light and accommodation. No scleral icterus. Extraocular  muscles intact.  HEENT: Head atraumatic, normocephalic. Oropharynx and nasopharynx clear.  NECK:  Supple, no jugular venous distention. No thyroid enlargement, no tenderness.  LUNGS: Normal breath sounds bilaterally, no wheezing, rales,rhonchi or crepitation. No use of accessory muscles of respiration.  CARDIOVASCULAR: S1, S2 normal. No murmurs, rubs, or gallops.  ABDOMEN: Soft, non-tender, non-distended. Bowel sounds present. No organomegaly or mass.  EXTREMITIES: No pedal edema, cyanosis, or clubbing.  NEUROLOGIC: Cranial nerves II through XII are intact. Muscle strength 5/5 in all extremities. Sensation intact. Gait not checked.  PSYCHIATRIC: The patient is alert and oriented x 3.  SKIN: No obvious rash, lesion, or ulcer.  DATA REVIEW:   CBC Recent Labs  Lab 03/10/17 0538  WBC 6.2  HGB 13.2  HCT 39.2  PLT 234    Chemistries  Recent Labs  Lab 03/10/17 0538  NA 137  K 3.5  CL 109  CO2 23  GLUCOSE 94  BUN 8  CREATININE 0.55  CALCIUM 8.5*      Management plans discussed with the patient, family and they are in agreement.  CODE STATUS: Prior   TOTAL TIME TAKING CARE OF THIS PATIENT: 45 minutes.    Delfino LovettVipul Seann Genther M.D on 03/12/2017 at 9:20 PM  Between 7am to 6pm - Pager - [phone]  After 6pm go to [email or web address] - Social research officer, governmentpassword EPAS [email or web address]  [facility] [place] [facility]  Office  [phone]  CC: Primary care physician; Patient, No Pcp Per   Note: This dictation was prepared with Dragon dictation along with smaller phrase technology. Any transcriptional errors that result from this process are unintentional.

## 2017-03-14 LAB — AEROBIC CULTURE W GRAM STAIN (SUPERFICIAL SPECIMEN): Special Requests: NORMAL

## 2017-03-14 LAB — AEROBIC CULTURE  (SUPERFICIAL SPECIMEN): GRAM STAIN: NONE SEEN

## 2017-05-30 DIAGNOSIS — Z309 Encounter for contraceptive management, unspecified: Secondary | ICD-10-CM | POA: Diagnosis not present

## 2017-07-22 DIAGNOSIS — A4902 Methicillin resistant Staphylococcus aureus infection, unspecified site: Secondary | ICD-10-CM | POA: Diagnosis not present

## 2017-09-03 ENCOUNTER — Encounter: Payer: Self-pay | Admitting: Family

## 2017-09-03 ENCOUNTER — Ambulatory Visit (INDEPENDENT_AMBULATORY_CARE_PROVIDER_SITE_OTHER): Payer: BLUE CROSS/BLUE SHIELD | Admitting: Family

## 2017-09-03 VITALS — BP 126/87 | HR 76 | Temp 98.1°F | Ht 66.0 in | Wt 117.8 lb

## 2017-09-03 DIAGNOSIS — Z22322 Carrier or suspected carrier of Methicillin resistant Staphylococcus aureus: Secondary | ICD-10-CM | POA: Diagnosis not present

## 2017-09-03 MED ORDER — CLINDAMYCIN PHOSPHATE 1 % EX GEL: Freq: Two times a day (BID) | CUTANEOUS | 0 refills | Status: DC

## 2017-09-03 MED ORDER — MUPIROCIN CALCIUM 2 % NA OINT: 1.0000 "application " | TOPICAL_OINTMENT | Freq: Two times a day (BID) | NASAL | 1 refills | Status: DC

## 2017-09-03 NOTE — Assessment & Plan Note (Signed)
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 Lesions are localized and have required I&D in the past. There is a chance that birth control may be contributing to increased risk of colonization. Recommend MRSA decontamination with chlorhexadine and mupirocin. Will provide clindamycin cream as needed for boils. Follow up if symptoms or frequency of infection do not improve.

## 2017-09-03 NOTE — Progress Notes (Signed)
Subjective:    Patient ID: Cassidy Boyd, female    DOB: Oct 22, 1995, 22 y.o.   MRN: [id number]  Chief Complaint  Patient presents with  . Other    Recurrent MRSA infection    HPI:  Cassidy Boyd is a 22 y.o. female who presents today for initial evaluation and treatment for recurrent MRSA infection.  Associated symptom recurrent MRSA infection has been going on for about 10 months and has been admitted to the hospital for the same in November 2018. The symptoms generally last about 1 week and are located on various areas of her body. No fevers, chills or night sweats. Previously been treated with Zyvox, vancomycin, clindamycin and doxycycline. Most recent lesion is located on her back and has been there for about 4 days and is slowly improving.    Allergies  Allergen Reactions  . Bactrim [Sulfamethoxazole-Trimethoprim] Rash      Outpatient Medications Prior to Visit  Medication Sig Dispense Refill  . Norgestimate-Ethinyl Estradiol Triphasic (TRI-SPRINTEC) 0.18/0.215/0.25 MG-35 MCG tablet Take 1 tablet by mouth daily.    Marland Kitchen linezolid (ZYVOX) 600 MG tablet Take 1 tablet (600 mg total) by mouth 2 (two) times daily. (Patient not taking: Reported on 09/03/2017) 14 tablet 0  . mupirocin nasal ointment (BACTROBAN NASAL) 2 % Place 1 application into the nose 2 (two) times daily.     No facility-administered medications prior to visit.      History reviewed. No pertinent past medical history.    Past Surgical History:  Procedure Laterality Date  . none        Family History  Problem Relation Age of Onset  . Diabetes Mellitus II Maternal Grandmother   . Diabetes Mellitus II Maternal Grandfather       Social History   Socioeconomic History  . Marital status: Single    Spouse name: Not on file  . Number of children: Not on file  . Years of education: Not on file  . Highest education level: Not on file  Occupational History    Employer: [employer]  Social Needs  . Financial resource strain: Not on file  . Food insecurity:    Worry: Not on file    Inability: Not on file  . Transportation needs:    Medical: Not on file    Non-medical: Not on file  Tobacco Use  . Smoking status: Never Smoker  . Smokeless tobacco: Never Used  Substance and Sexual Activity  . Alcohol use: Yes  . Drug use: No  . Sexual activity: Yes  Lifestyle  . Physical activity:    Days per week: Not on file    Minutes per session: Not on file  . Stress: Not on file  Relationships  . Social connections:    Talks on phone: Not on file    Gets together: Not on file    Attends religious service: Not on file    Active member of club or organization: Not on file    Attends meetings of clubs or organizations: Not on file    Relationship status: Not on file  . Intimate partner violence:    Fear of current or ex partner: Not on file    Emotionally abused: Not on file    Physically abused: Not on file    Forced sexual activity: Not on file  Other Topics Concern  . Not on file  Social History Narrative  . Not on file      Review  of Systems  Constitutional: Negative for chills and fever.  Respiratory: Negative for chest tightness, shortness of breath and wheezing.   Cardiovascular: Negative for chest pain.  Skin: Positive for rash. Negative for color change, pallor and wound.       Objective:    BP 126/87   Pulse 76   Temp 98.1 F (36.7 C)   Ht  (1.676 m)   Wt 117 lb 12.8 oz (53.4 kg)   SpO2 100%   BMI 19.01 kg/m  Nursing note and vital signs reviewed.  Physical Exam  Constitutional: She is oriented to person, place, and time. She appears well-developed and well-nourished. No distress.  Cardiovascular: Normal rate, regular rhythm, normal heart sounds and intact distal pulses. Exam reveals no gallop and no friction rub.  No murmur heard. Pulmonary/Chest: Effort normal and breath sounds normal. No stridor. No respiratory  distress. She has no wheezes. She has no rales. She exhibits no tenderness.  Neurological: She is alert and oriented to person, place, and time.  Skin: Skin is warm and dry.  Approximately dime sized lesion/cyst near the vertebral border of the left scapula. There is mild tenderness.   Psychiatric: She has a normal mood and affect. Her behavior is normal.        Assessment & Plan:   Problem List Items Addressed This Visit      Other   MRSA colonization - Primary    Ms. Vallo is likely colonized with MRSA resulting in recurrent boils occurring every couple of months. Lesions are localized and have required I&D in the past. There is a chance that birth control may be contributing to increased risk of colonization. Recommend MRSA decontamination with chlorhexadine and mupirocin. Will provide clindamycin cream as needed for boils. Follow up if symptoms or frequency of infection do not improve.       Relevant Medications   mupirocin nasal ointment (BACTROBAN NASAL) 2 %   clindamycin (CLINDAGEL) 1 % gel       I have discontinued Rowan Bertram's linezolid. I have also changed her mupirocin nasal ointment. Additionally, I am having her start on clindamycin. Lastly, I am having her maintain her Norgestimate-Ethinyl Estradiol Triphasic.   Meds ordered this encounter  Medications  . mupirocin nasal ointment (BACTROBAN NASAL) 2 %    Sig: Place 1 application into the nose 2 (two) times daily for 7 days.    Dispense:  10 g    Refill:  1    Order Specific Question:   Supervising Provider    Answer:   Judyann Munson [[id number]]  . clindamycin (CLINDAGEL) 1 % gel    Sig: Apply topically 2 (two) times daily.    Dispense:  30 g    Refill:  0    Order Specific Question:   Supervising Provider    Answer:   Judyann Munson [[id number]]     Follow-up: Return if symptoms worsen or fail to improve.    Jeanine Luz, FNP [facility]

## 2017-09-03 NOTE — Patient Instructions (Addendum)
Nice to meet you.  Please continue to use warm compresses as needed.  Recommend using Hibiclens wash daily for 10-14 days along with Mupiricin cream into the nares twice daily for 10-14 days.  Clindamycin gel as needed for infection.  May also consider dilute bleach bath:   Dilute bleach baths (one teaspoon bleach per gallon of water, or one-fourth cup bleach per one-fourth tub [approximately 13 gallons of water] for 15 minutes twice weekly) for approximately three months  Please let us know if you have any questions or concerns.

## 2018-02-03 DIAGNOSIS — Z23 Encounter for immunization: Secondary | ICD-10-CM | POA: Diagnosis not present

## 2018-02-27 MED ORDER — DOXYCYCLINE HYCLATE 100 MG PO TABS: 100.0000 mg | ORAL_TABLET | Freq: Two times a day (BID) | ORAL | 0 refills | Status: DC

## 2018-03-25 DIAGNOSIS — Z01419 Encounter for gynecological examination (general) (routine) without abnormal findings: Secondary | ICD-10-CM | POA: Diagnosis not present

## 2018-03-25 DIAGNOSIS — Z3009 Encounter for other general counseling and advice on contraception: Secondary | ICD-10-CM | POA: Diagnosis not present

## 2018-04-29 IMAGING — CT CT MAXILLOFACIAL W/ CM
3 series · 15 of 47 positions shown, 18 images · IV contrast (iopamidol)
Comparison: None.

CLINICAL DATA: Facial infection

EXAM:
CT MAXILLOFACIAL WITH CONTRAST
TECHNIQUE: Multidetector CT imaging of the maxillofacial structures was
performed with intravenous contrast. Multiplanar CT image
reconstructions were also generated.
CONTRAST:  75mL WTQF73-MSS IOPAMIDOL (WTQF73-MSS) INJECTION 61%

[Series 2: max soft · axial · 0.29mm/px · z∈[-189,-67]mm · 9 of 71 slices shown, 12 images]
[im 5/71  brain]
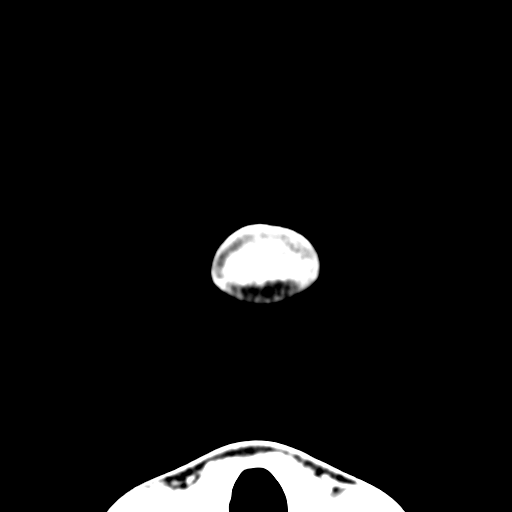
[im 5/71  bone]
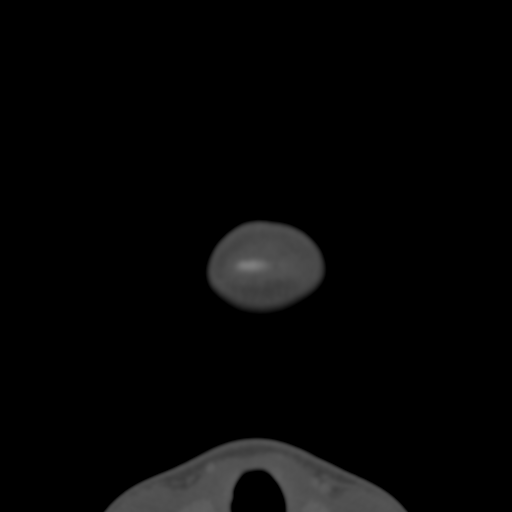
[im 13/71  bone]
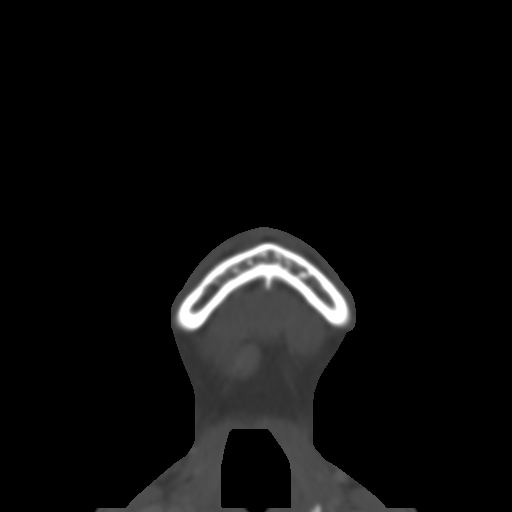
[im 20/71  bone]
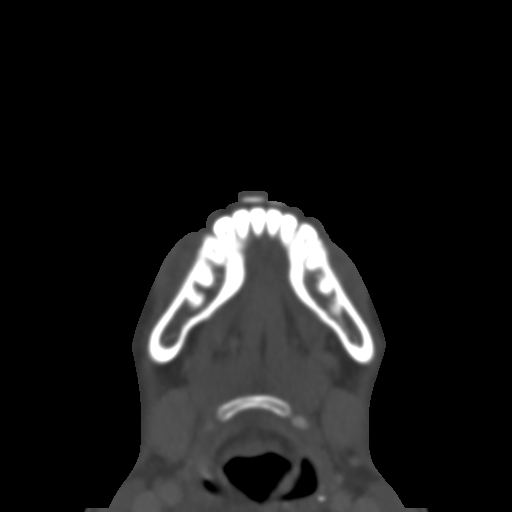
[im 27/71  bone]
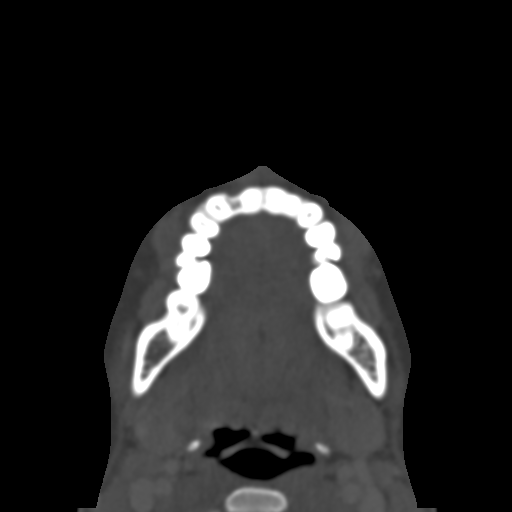
[im 37/71  brain]
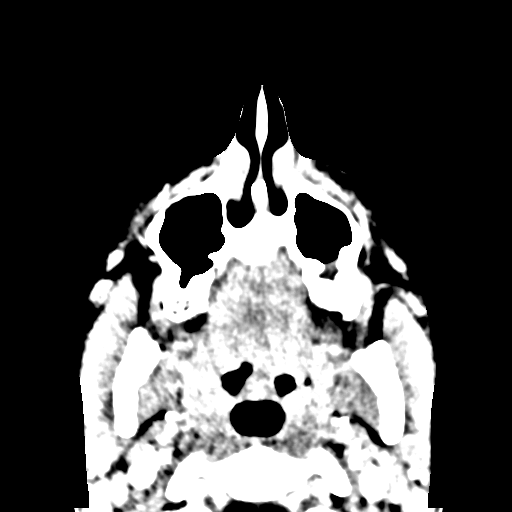
[im 37/71  bone]
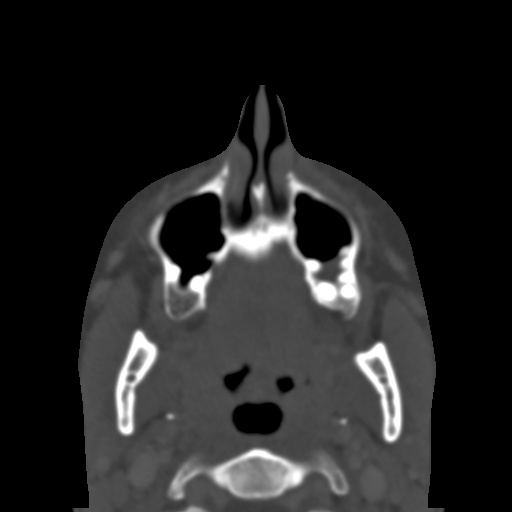
[im 44/71  bone]
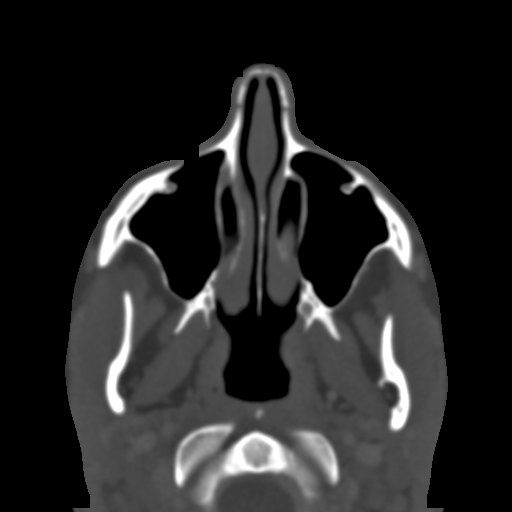
[im 51/71  bone]
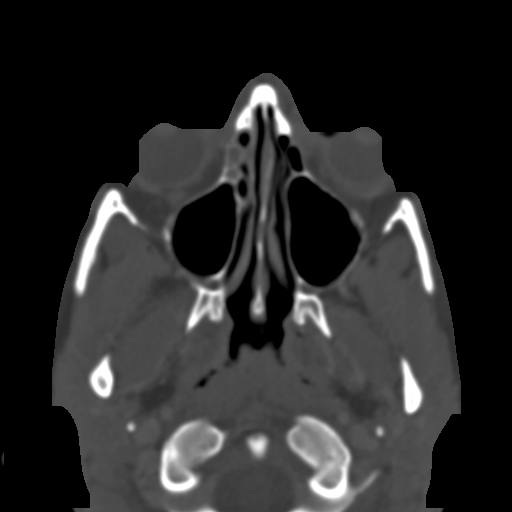
[im 58/71  bone]
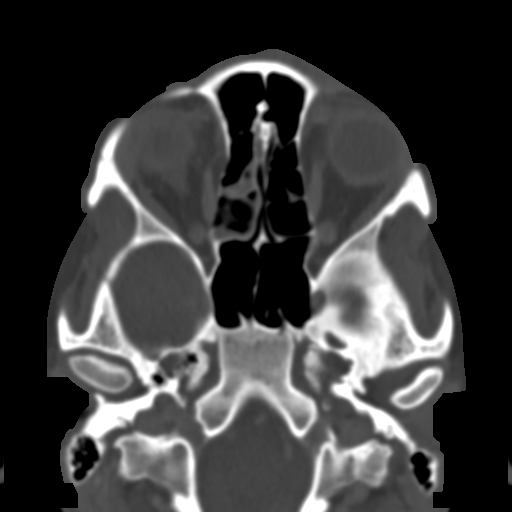
[im 66/71  brain]
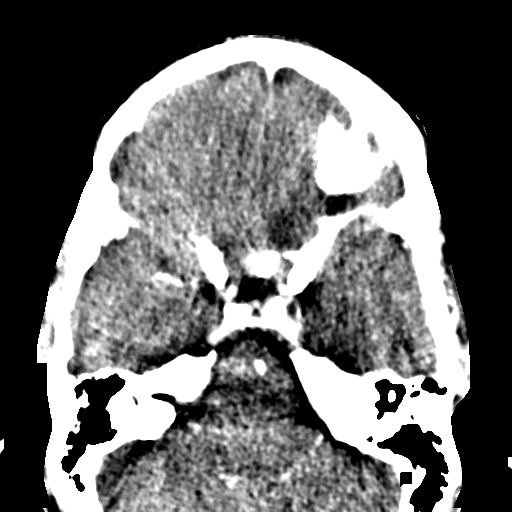
[im 66/71  bone]
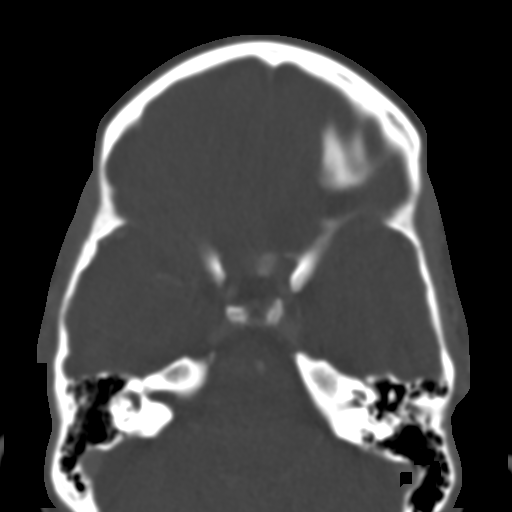

[Series 4: coronal soft · coronal · 0.27mm/px · 3 of 63 slices shown]
[im 21/63  bone]
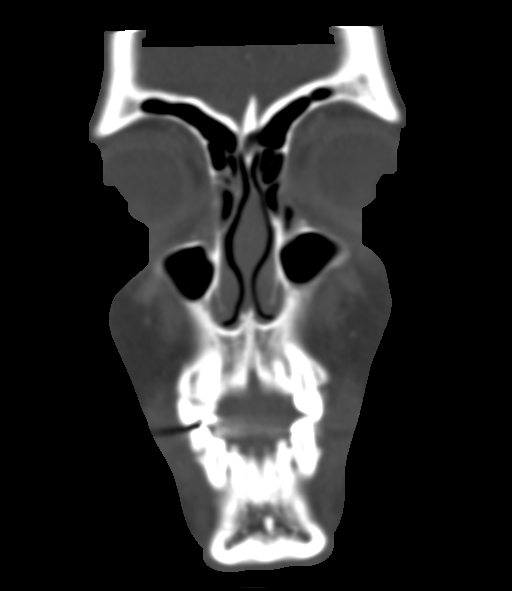
[im 28/63  bone]
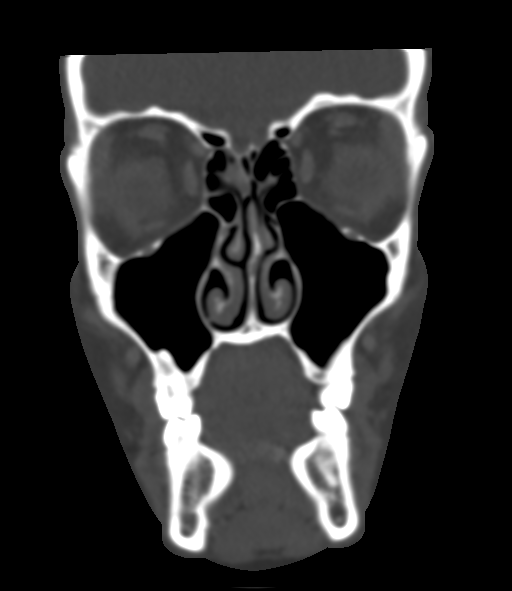
[im 35/63  bone]
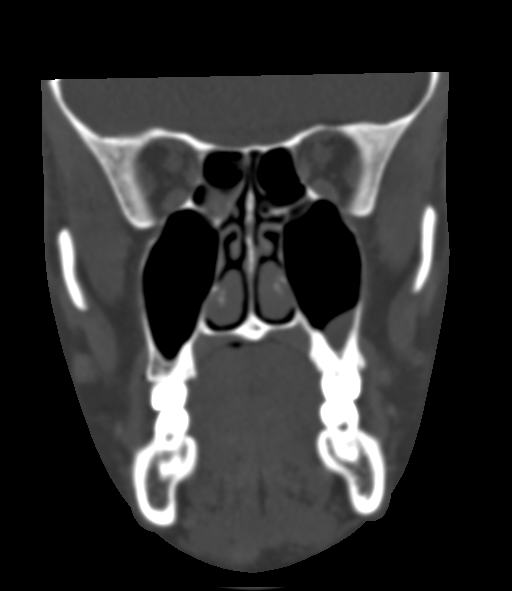

[Series 5: sagittal soft · sagittal · 0.25mm/px · 3 of 69 slices shown]
[im 23/69  bone]
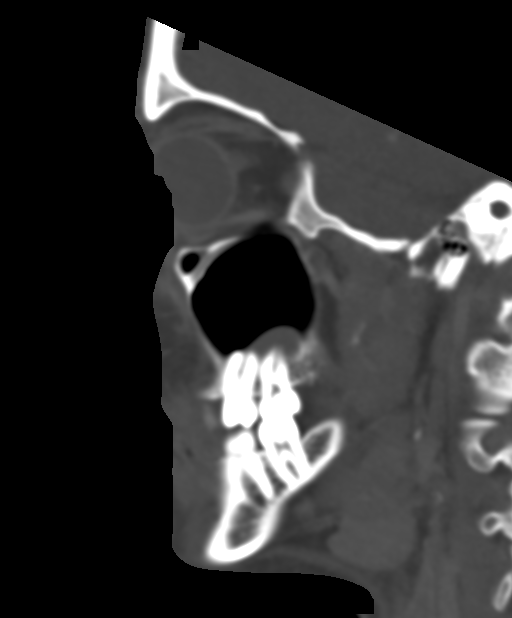
[im 35/69  bone]
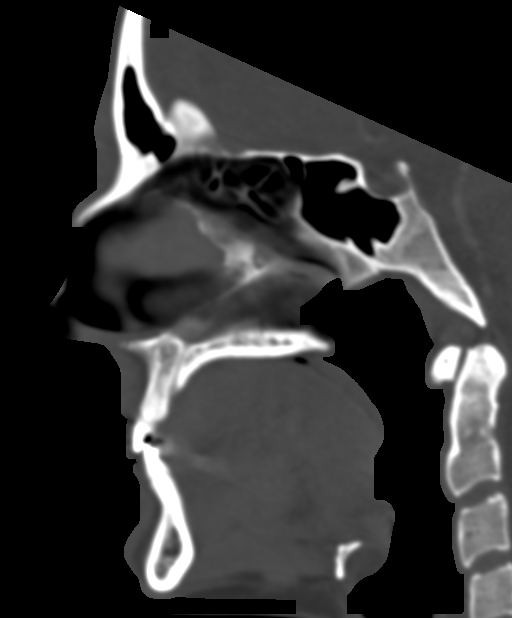
[im 46/69  bone]
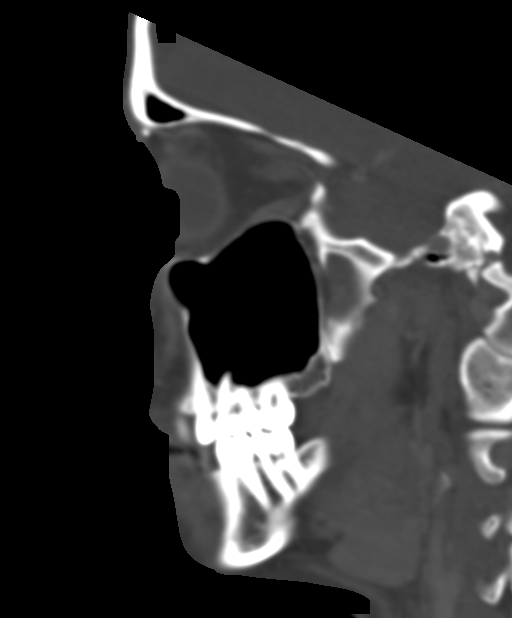

[15 of 47 positions shown; findings below may reference images not displayed]

FINDINGS: Osseous:  No facial fracture or other acute osseous abnormality.

Orbits: The globes are intact. Normal appearance of the intra- and
extraconal fat. Symmetric extraocular muscles.

Sinuses: No fluid levels or advanced mucosal thickening.

Soft tissues: There is thickening and increased density of the soft
tissues of the forehead extending inferiorly to the superior nasal
bridge. There is no focal fluid collection. No abnormality of the
underlying sinus. No extension to the postseptal orbit.

Limited intracranial: Normal.
IMPRESSION: Frontal soft tissue cellulitis extending to the superior nasal
bridge without abscess or fluid collection. No extension to the
orbit. No underlying sinusitis or other likely source.

## 2018-05-06 DIAGNOSIS — Z113 Encounter for screening for infections with a predominantly sexual mode of transmission: Secondary | ICD-10-CM | POA: Diagnosis not present

## 2018-05-06 DIAGNOSIS — Z3200 Encounter for pregnancy test, result unknown: Secondary | ICD-10-CM | POA: Diagnosis not present

## 2018-05-06 DIAGNOSIS — Z01419 Encounter for gynecological examination (general) (routine) without abnormal findings: Secondary | ICD-10-CM | POA: Diagnosis not present

## 2018-05-06 DIAGNOSIS — Z30430 Encounter for insertion of intrauterine contraceptive device: Secondary | ICD-10-CM | POA: Diagnosis not present

## 2018-05-06 DIAGNOSIS — Z124 Encounter for screening for malignant neoplasm of cervix: Secondary | ICD-10-CM | POA: Diagnosis not present

## 2018-05-26 ENCOUNTER — Telehealth: Payer: Self-pay | Admitting: Behavioral Health

## 2018-05-26 NOTE — Telephone Encounter (Signed)
Patient states she woke up with a new spot on her nose.  Patient sent a my chart message earlier today with pictures for Marcos Eke to review.  Patient states she has a history of this and was requesting antibiotics which has helped in the past.  Patient states she used [facility] in [place]. Angeline Slim RN

## 2018-05-27 MED ORDER — DOXYCYCLINE HYCLATE 100 MG PO TABS: 100.0000 mg | ORAL_TABLET | Freq: Two times a day (BID) | ORAL | 0 refills | Status: DC

## 2018-05-27 NOTE — Telephone Encounter (Signed)
Called patient and informed her Doxycycline was sent to [facility] in [place].  Patient was instructed to take one pill twice a day for 7 days.  Patient verbalized understadning and was appreciative. Angeline Slim RN

## 2018-05-27 NOTE — Addendum Note (Signed)
Addended by: Jeanine Luz D on: 05/27/2018 08:44 AM   Modules accepted: Orders

## 2018-05-27 NOTE — Telephone Encounter (Signed)
Doxycycline has been sent to to the pharmacy.

## 2018-07-09 ENCOUNTER — Telehealth: Payer: BLUE CROSS/BLUE SHIELD | Admitting: Family

## 2018-07-09 DIAGNOSIS — L03211 Cellulitis of face: Secondary | ICD-10-CM | POA: Diagnosis not present

## 2018-07-09 MED ORDER — DOXYCYCLINE HYCLATE 100 MG PO TABS: 100.0000 mg | ORAL_TABLET | Freq: Two times a day (BID) | ORAL | 0 refills | Status: DC

## 2018-07-09 NOTE — Progress Notes (Signed)
E Visit for Cellulitis  We are sorry that you are not feeling well. Here is how we plan to help!  Based on what you shared with me it looks like you have cellulitis.  Cellulitis looks like areas of skin redness, swelling, and warmth; it develops as a result of bacteria entering under the skin. Little red spots and/or bleeding can be seen in skin, and tiny surface sacs containing fluid can occur. Fever can be present. Cellulitis is almost always on one side of a body, and the lower limbs are the most common site of involvement.   I have prescribed:  Doxycycline 100 mg twice a day for 7 days. Please schedule a follow up visit with your Infection Disease provider.   Approximately 5 minutes was spent documenting and reviewing patient's chart.    HOME CARE:  . Take your medications as ordered and take all of them, even if the skin irritation appears to be healing.   GET HELP RIGHT AWAY IF:  . Symptoms that don't begin to go away within 48 hours. . Severe redness persists or worsens . If the area turns color, spreads or swells. . If it blisters and opens, develops yellow-brown crust or bleeds. . You develop a fever or chills. . If the pain increases or becomes unbearable.  . Are unable to keep fluids and food down.  MAKE SURE YOU    Understand these instructions.  Will watch your condition.  Will get help right away if you are not doing well or get worse.  Thank you for choosing an e-visit. Your e-visit answers were reviewed by a board certified advanced clinical practitioner to complete your personal care plan. Depending upon the condition, your plan could have included both over the counter or prescription medications. Please review your pharmacy choice. Make sure the pharmacy is open so you can pick up prescription now. If there is a problem, you may contact your provider through [employer] and have the prescription routed to another pharmacy. Your safety is important to [place].  If you have drug allergies check your prescription carefully.  For the next 24 hours you can use MyChart to ask questions about today's visit, request a non-urgent call back, or ask for a work or school excuse. You will get an email in the next two days asking about your experience. I hope that your e-visit has been valuable and will speed your recovery.

## 2018-08-24 ENCOUNTER — Telehealth: Payer: BLUE CROSS/BLUE SHIELD | Admitting: Family

## 2018-08-24 ENCOUNTER — Other Ambulatory Visit: Payer: Self-pay

## 2018-08-24 DIAGNOSIS — L03211 Cellulitis of face: Secondary | ICD-10-CM

## 2018-08-24 MED ORDER — DOXYCYCLINE HYCLATE 100 MG PO TABS: 100.0000 mg | ORAL_TABLET | Freq: Two times a day (BID) | ORAL | 0 refills | Status: DC

## 2018-08-24 NOTE — Progress Notes (Signed)
Greater than 5 minutes, yet less than 10 minutes of time have been spent researching, coordinating, and implementing care for this patient today.  Thank you for the details you included in the comment boxes. Those details are very helpful in determining the best course of treatment for you and help [place] to provide the best care.  E Visit for Cellulitis  We are sorry that you are not feeling well. Here is how we plan to help!  Based on what you shared with me it looks like you have cellulitis.  Cellulitis looks like areas of skin redness, swelling, and warmth; it develops as a result of bacteria entering under the skin. Little red spots and/or bleeding can be seen in skin, and tiny surface sacs containing fluid can occur. Fever can be present. Cellulitis is almost always on one side of a body, and the lower limbs are the most common site of involvement.   I have prescribed:  Doxycycline 100mg  by mouth twice daily for 10 days.   HOME CARE:  . Take your medications as ordered and take all of them, even if the skin irritation appears to be healing.   GET HELP RIGHT AWAY IF:  . Symptoms that don't begin to go away within 48 hours. . Severe redness persists or worsens . If the area turns color, spreads or swells. . If it blisters and opens, develops yellow-brown crust or bleeds. . You develop a fever or chills. . If the pain increases or becomes unbearable.  . Are unable to keep fluids and food down.  MAKE SURE YOU    Understand these instructions.  Will watch your condition.  Will get help right away if you are not doing well or get worse.  Thank you for choosing an e-visit. Your e-visit answers were reviewed by a board certified advanced clinical practitioner to complete your personal care plan. Depending upon the condition, your plan could have included both over the counter or prescription medications. Please review your pharmacy choice. Make sure the pharmacy is open so you can pick  up prescription now. If there is a problem, you may contact your provider through [employer] and have the prescription routed to another pharmacy. Your safety is important to [place]. If you have drug allergies check your prescription carefully.  For the next 24 hours you can use MyChart to ask questions about today's visit, request a non-urgent call back, or ask for a work or school excuse. You will get an email in the next two days asking about your experience. I hope that your e-visit has been valuable and will speed your recovery.

## 2018-11-14 DIAGNOSIS — R300 Dysuria: Secondary | ICD-10-CM | POA: Diagnosis not present

## 2018-11-27 ENCOUNTER — Telehealth: Payer: BLUE CROSS/BLUE SHIELD | Admitting: Family

## 2018-11-27 DIAGNOSIS — L03211 Cellulitis of face: Secondary | ICD-10-CM

## 2018-11-27 MED ORDER — CLINDAMYCIN HCL 300 MG PO CAPS: 300.0000 mg | ORAL_CAPSULE | Freq: Three times a day (TID) | ORAL | 0 refills | Status: DC

## 2018-11-27 NOTE — Progress Notes (Signed)
E Visit for Cellulitis  We are sorry that you are not feeling well. Here is how we plan to help!  Based on what you shared with me it looks like you have cellulitis.  Cellulitis looks like areas of skin redness, swelling, and warmth; it develops as a result of bacteria entering under the skin. Little red spots and/or bleeding can be seen in skin, and tiny surface sacs containing fluid can occur. Fever can be present. Cellulitis is almost always on one side of a body, and the lower limbs are the most common site of involvement.   I have prescribed:  Clindamycin, 450mg  orally three times a day for 5 days  HOME CARE:  . Take your medications as ordered and take all of them, even if the skin irritation appears to be healing.   GET HELP RIGHT AWAY IF:  . Symptoms that don't begin to go away within 48 hours. . Severe redness persists or worsens . If the area turns color, spreads or swells. . If it blisters and opens, develops yellow-brown crust or bleeds. . You develop a fever or chills. . If the pain increases or becomes unbearable.  . Are unable to keep fluids and food down.  MAKE SURE YOU    Understand these instructions.  Will watch your condition.  Will get help right away if you are not doing well or get worse.  Thank you for choosing an e-visit. Your e-visit answers were reviewed by a board certified advanced clinical practitioner to complete your personal care plan. Depending upon the condition, your plan could have included both over the counter or prescription medications. Please review your pharmacy choice. Make sure the pharmacy is open so you can pick up prescription now. If there is a problem, you may contact your provider through [employer] and have the prescription routed to another pharmacy. Your safety is important to [place]. If you have drug allergies check your prescription carefully.  For the next 24 hours you can use MyChart to ask questions about today's visit,  request a non-urgent call back, or ask for a work or school excuse. You will get an email in the next two days asking about your experience. I hope that your e-visit has been valuable and will speed your recovery.

## 2019-01-14 ENCOUNTER — Telehealth: Payer: BLUE CROSS/BLUE SHIELD | Admitting: Family

## 2019-01-14 DIAGNOSIS — L03211 Cellulitis of face: Secondary | ICD-10-CM

## 2019-01-14 NOTE — Progress Notes (Signed)
Based on what you shared with me, I feel your condition warrants further evaluation and I recommend that you be seen for a face to face office visit.  After reviewing your chart, it looks like you were treated for similar symptoms on 11/27/18. Given that these symptoms have returned, I recommend you follow up face to face to be evaluated. You may need a culture to make sure you are on the correct antibiotics.   NOTE: If you entered your credit card information for this eVisit, you will not be charged. You may see a "hold" on your card for the $35 but that hold will drop off and you will not have a charge processed.  If you are having a true medical emergency please call 911.     For an urgent face to face visit, [facility] has four urgent care centers for your convenience:   . [facility]    [phone]                  Get Driving Directions  [address], [place] [address] . 10 am to 8 pm Monday-Friday . 12 pm to 8 pm Saturday-Sunday   . [facility] at [place]                  Get Driving Directions  [address], [place], [place] [address] . 8 am to 8 pm Monday-Friday . 9 am to 6 pm Saturday . 11 am to 6 pm Sunday   . [facility] at [place]                  Get Driving Directions   [address] [place], [place] [address] . 8 am to 8 pm Monday-Friday . 8 am to 4 pm Saturday-Sunday    . [facility] at [place]                    Get Driving Directions  [phone]  [address] [place], [place] [address]  . Monday-Friday, 12 PM to 6 PM    Your e-visit answers were reviewed by a board certified advanced clinical practitioner to complete your personal care plan.  Thank you for using e-Visits.

## 2019-01-22 DIAGNOSIS — L700 Acne vulgaris: Secondary | ICD-10-CM | POA: Diagnosis not present

## 2019-01-22 DIAGNOSIS — Z8614 Personal history of Methicillin resistant Staphylococcus aureus infection: Secondary | ICD-10-CM | POA: Diagnosis not present

## 2019-01-22 DIAGNOSIS — Z09 Encounter for follow-up examination after completed treatment for conditions other than malignant neoplasm: Secondary | ICD-10-CM | POA: Diagnosis not present

## 2019-01-26 DIAGNOSIS — Z23 Encounter for immunization: Secondary | ICD-10-CM | POA: Diagnosis not present

## 2019-03-10 NOTE — Progress Notes (Signed)
Greater than 5 minutes, yet less than 10 minutes of time have been spent researching, coordinating, and implementing care for this patient today.  Thank you for the details you included in the comment boxes. Those details are very helpful in determining the best course of treatment for you and help us to provide the best care.  

## 2019-04-30 DIAGNOSIS — Z8614 Personal history of Methicillin resistant Staphylococcus aureus infection: Secondary | ICD-10-CM | POA: Diagnosis not present

## 2019-04-30 DIAGNOSIS — Z09 Encounter for follow-up examination after completed treatment for conditions other than malignant neoplasm: Secondary | ICD-10-CM | POA: Diagnosis not present

## 2019-04-30 DIAGNOSIS — L700 Acne vulgaris: Secondary | ICD-10-CM | POA: Diagnosis not present

## 2019-06-08 DIAGNOSIS — L02221 Furuncle of abdominal wall: Secondary | ICD-10-CM | POA: Diagnosis not present

## 2019-06-10 ENCOUNTER — Telehealth: Payer: BC Managed Care – PPO | Admitting: Physician Assistant

## 2019-06-10 DIAGNOSIS — L03211 Cellulitis of face: Secondary | ICD-10-CM | POA: Diagnosis not present

## 2019-06-10 MED ORDER — CLINDAMYCIN HCL 150 MG PO CAPS: 450.0000 mg | ORAL_CAPSULE | Freq: Three times a day (TID) | ORAL | 0 refills | Status: AC

## 2019-06-10 NOTE — Progress Notes (Signed)
E Visit for Cellulitis  We are sorry that you are not feeling well. Here is how we plan to help!  Based on what you shared with me it looks like you have cellulitis.  Cellulitis looks like areas of skin redness, swelling, and warmth; it develops as a result of bacteria entering under the skin. Little red spots and/or bleeding can be seen in skin, and tiny surface sacs containing fluid can occur. Fever can be present. Cellulitis is almost always on one side of a body, and the lower limbs are the most common site of involvement.   I have prescribed:  Clindamycin 450 mg take one by mouth three times a day for 5 days  It appears that your culture results grew MRSA. The medicine prescribed should take care of this.  HOME CARE:  . Take your medications as ordered and take all of them, even if the skin irritation appears to be healing.   GET HELP RIGHT AWAY IF:  . Symptoms that don't begin to go away within 48 hours. . Severe redness persists or worsens . If the area turns color, spreads or swells. . If it blisters and opens, develops yellow-brown crust or bleeds. . You develop a fever or chills. . If the pain increases or becomes unbearable.  . Are unable to keep fluids and food down.  MAKE SURE YOU    Understand these instructions.  Will watch your condition.  Will get help right away if you are not doing well or get worse.  Thank you for choosing an e-visit. Your e-visit answers were reviewed by a board certified advanced clinical practitioner to complete your personal care plan. Depending upon the condition, your plan could have included both over the counter or prescription medications. Please review your pharmacy choice. Make sure the pharmacy is open so you can pick up prescription now. If there is a problem, you may contact your provider through [employer] and have the prescription routed to another pharmacy. Your safety is important to [place]. If you have drug allergies check  your prescription carefully.  For the next 24 hours you can use MyChart to ask questions about today's visit, request a non-urgent call back, or ask for a work or school excuse. You will get an email in the next two days asking about your experience. I hope that your e-visit has been valuable and will speed your recovery.   6 minutes spent on chart

## 2019-09-20 ENCOUNTER — Telehealth: Payer: BC Managed Care – PPO | Admitting: Family

## 2019-09-20 DIAGNOSIS — L0390 Cellulitis, unspecified: Secondary | ICD-10-CM | POA: Diagnosis not present

## 2019-09-20 MED ORDER — CEPHALEXIN 500 MG PO CAPS: 500.0000 mg | ORAL_CAPSULE | Freq: Four times a day (QID) | ORAL | 0 refills | Status: AC

## 2019-09-20 NOTE — Progress Notes (Signed)
E Visit for Cellulitis  We are sorry that you are not feeling well. Here is how we plan to help!  Based on what you shared with me it looks like you have cellulitis.  Cellulitis looks like areas of skin redness, swelling, and warmth; it develops as a result of bacteria entering under the skin. Little red spots and/or bleeding can be seen in skin, and tiny surface sacs containing fluid can occur. Fever can be present. Cellulitis is almost always on one side of a body, and the lower limbs are the most common site of involvement.   I have prescribed:  Keflex 500mg take one by mouth four times a day for 5 days  HOME CARE:  . Take your medications as ordered and take all of them, even if the skin irritation appears to be healing.   GET HELP RIGHT AWAY IF:  . Symptoms that don't begin to go away within 48 hours. . Severe redness persists or worsens . If the area turns color, spreads or swells. . If it blisters and opens, develops yellow-brown crust or bleeds. . You develop a fever or chills. . If the pain increases or becomes unbearable.  . Are unable to keep fluids and food down.  MAKE SURE YOU    Understand these instructions.  Will watch your condition.  Will get help right away if you are not doing well or get worse.  Thank you for choosing an e-visit. Your e-visit answers were reviewed by a board certified advanced clinical practitioner to complete your personal care plan. Depending upon the condition, your plan could have included both over the counter or prescription medications. Please review your pharmacy choice. Make sure the pharmacy is open so you can pick up prescription now. If there is a problem, you may contact your provider through MyChart messaging and have the prescription routed to another pharmacy. Your safety is important to us. If you have drug allergies check your prescription carefully.  For the next 24 hours you can use MyChart to ask questions about today's  visit, request a non-urgent call back, or ask for a work or school excuse. You will get an email in the next two days asking about your experience. I hope that your e-visit has been valuable and will speed your recovery.  Approximately 5 minutes was spent documenting and reviewing patient's chart.   

## 2019-09-21 DIAGNOSIS — L02426 Furuncle of left lower limb: Secondary | ICD-10-CM | POA: Diagnosis not present

## 2019-09-21 DIAGNOSIS — B9562 Methicillin resistant Staphylococcus aureus infection as the cause of diseases classified elsewhere: Secondary | ICD-10-CM | POA: Diagnosis not present

## 2019-09-21 DIAGNOSIS — L02425 Furuncle of right lower limb: Secondary | ICD-10-CM | POA: Diagnosis not present

## 2019-11-05 ENCOUNTER — Encounter: Payer: Self-pay | Admitting: Family Medicine

## 2019-11-05 ENCOUNTER — Other Ambulatory Visit: Payer: Self-pay

## 2019-11-05 ENCOUNTER — Ambulatory Visit (INDEPENDENT_AMBULATORY_CARE_PROVIDER_SITE_OTHER): Payer: BC Managed Care – PPO | Admitting: Family Medicine

## 2019-11-05 VITALS — BP 104/62 | HR 94 | Temp 97.8°F | Ht 66.5 in | Wt 112.5 lb

## 2019-11-05 DIAGNOSIS — Z833 Family history of diabetes mellitus: Secondary | ICD-10-CM

## 2019-11-05 DIAGNOSIS — Z22322 Carrier or suspected carrier of Methicillin resistant Staphylococcus aureus: Secondary | ICD-10-CM

## 2019-11-05 DIAGNOSIS — Z23 Encounter for immunization: Secondary | ICD-10-CM

## 2019-11-05 DIAGNOSIS — Z975 Presence of (intrauterine) contraceptive device: Secondary | ICD-10-CM | POA: Insufficient documentation

## 2019-11-05 DIAGNOSIS — L089 Local infection of the skin and subcutaneous tissue, unspecified: Secondary | ICD-10-CM

## 2019-11-05 DIAGNOSIS — Z8249 Family history of ischemic heart disease and other diseases of the circulatory system: Secondary | ICD-10-CM

## 2019-11-05 LAB — LIPID PANEL
Cholesterol: 138 mg/dL (ref 0–200)
HDL: 63.7 mg/dL (ref >39.00)
LDL Cholesterol: 66 mg/dL (ref 0–99)
NonHDL: 74.5
Total CHOL/HDL Ratio: 2
Triglycerides: 45 mg/dL (ref 0.0–149.0)
VLDL: 9 mg/dL (ref 0.0–40.0)

## 2019-11-05 LAB — HEMOGLOBIN A1C: Hgb A1c MFr Bld: 5 % (ref 4.6–6.5)

## 2019-11-05 NOTE — Assessment & Plan Note (Signed)
Appreciate dermatology support. Given recurrent nature will screen for diabetes due to family history.

## 2019-11-05 NOTE — Progress Notes (Signed)
Subjective:     Cassidy Boyd is a 24 y.o. female presenting for Establish Care (wants bloodwork today. is fasting)     HPI   #MRSA - ongoing for 3 weeks - started seeing dermatology last summer  - when she has been getting prescriptions through dermatology - taking a bleach bath once weekly and has special skin care plan  #Blood work - wondering about family hx of diabetes and her recurrent skin infection  Review of Systems   Social History   Tobacco Use  Smoking Status Never Smoker  Smokeless Tobacco Never Used        Objective:    BP Readings from Last 3 Encounters:  11/05/19 (!) 104/62  09/03/17 126/87  03/11/17 126/70   Wt Readings from Last 3 Encounters:  11/05/19 112 lb 8 oz (51 kg)  09/03/17 117 lb 12.8 oz (53.4 kg)  03/10/17 105 lb 6.4 oz (47.8 kg)    BP (!) 104/62   Pulse 94   Temp 97.8 F (36.6 C) (Temporal)   Ht 5' 6.5" (1.689 m)   Wt 112 lb 8 oz (51 kg)   SpO2 100%   BMI 17.89 kg/m    Physical Exam Constitutional:      General: She is not in acute distress.    Appearance: She is well-developed. She is not diaphoretic.  HENT:     Right Ear: External ear normal.     Left Ear: External ear normal.     Nose: Nose normal.  Eyes:     Conjunctiva/sclera: Conjunctivae normal.  Cardiovascular:     Rate and Rhythm: Normal rate and regular rhythm.     Heart sounds: No murmur heard.   Pulmonary:     Effort: Pulmonary effort is normal. No respiratory distress.     Breath sounds: Normal breath sounds. No wheezing.  Musculoskeletal:     Cervical back: Neck supple.  Skin:    General: Skin is warm and dry.     Capillary Refill: Capillary refill takes less than 2 seconds.     Comments: Healing boil on right thigh  Neurological:     Mental Status: She is alert. Mental status is at baseline.  Psychiatric:        Mood and Affect: Mood normal.        Behavior: Behavior normal.           Assessment & Plan:   Problem List Items  Addressed This Visit      Musculoskeletal and Integument   Recurrent infection of skin - Primary    Appreciate dermatology support. Given recurrent nature will screen for diabetes due to family history.       Relevant Orders   Hemoglobin A1c     Other   MRSA colonization    Other Visit Diagnoses    Need for Td vaccine       Relevant Orders   Td vaccine greater than or equal to 7yo preservative free IM (Completed)   Family history of diabetes mellitus       Relevant Orders   Hemoglobin A1c   Family history of heart attack       Relevant Orders   Lipid panel       Return in about 1 year (around 11/04/2020).  Lynnda Child, MD  This visit occurred during the SARS-CoV-2 public health emergency.  Safety protocols were in place, including screening questions prior to the visit, additional usage of staff PPE, and extensive cleaning  of exam room while observing appropriate contact time as indicated for disinfecting solutions.

## 2019-11-25 ENCOUNTER — Encounter: Payer: Self-pay | Admitting: Family Medicine

## 2019-11-25 DIAGNOSIS — L03031 Cellulitis of right toe: Secondary | ICD-10-CM

## 2019-11-26 MED ORDER — MUPIROCIN 2 % EX OINT: 1.0000 "application " | TOPICAL_OINTMENT | Freq: Two times a day (BID) | CUTANEOUS | 0 refills | Status: DC

## 2019-12-04 ENCOUNTER — Encounter: Payer: Self-pay | Admitting: Family Medicine

## 2020-12-07 ENCOUNTER — Telehealth: Payer: BC Managed Care – PPO | Admitting: Family

## 2020-12-07 DIAGNOSIS — J029 Acute pharyngitis, unspecified: Secondary | ICD-10-CM

## 2020-12-07 MED ORDER — AMOXICILLIN 500 MG PO CAPS: 500.0000 mg | ORAL_CAPSULE | Freq: Two times a day (BID) | ORAL | 0 refills | Status: AC

## 2020-12-07 NOTE — Progress Notes (Signed)

## 2021-02-15 ENCOUNTER — Telehealth: Payer: BC Managed Care – PPO | Admitting: Physician Assistant

## 2021-02-15 DIAGNOSIS — J340 Abscess, furuncle and carbuncle of nose: Secondary | ICD-10-CM

## 2021-02-15 MED ORDER — CEPHALEXIN 500 MG PO CAPS: 500.0000 mg | ORAL_CAPSULE | Freq: Four times a day (QID) | ORAL | 0 refills | Status: AC

## 2021-02-15 NOTE — Progress Notes (Signed)
I have spent 5 minutes in review of e-visit questionnaire, review and updating patient chart, medical decision making and response to patient.   Bernetha Anschutz Cody Rula [place], PA-C    

## 2021-02-15 NOTE — Progress Notes (Signed)
Message sent to patient requesting further input regarding current symptoms. Awaiting patient response.  

## 2021-02-15 NOTE — Progress Notes (Signed)
E Visit for Cellulitis  We are sorry that you are not feeling well. Here is how we plan to help!  Based on what you shared with me it looks like you have cellulitis.  Cellulitis looks like areas of skin redness, swelling, and warmth; it develops as a result of bacteria entering under the skin. Little red spots and/or bleeding can be seen in skin, and tiny surface sacs containing fluid can occur. Fever can be present. Cellulitis is almost always on one side of a body, and the lower limbs are the most common site of involvement.   I have prescribed:  Keflex 500mg take one by mouth four times a day for 5 days  HOME CARE:  Take your medications as ordered and take all of them, even if the skin irritation appears to be healing.   GET HELP RIGHT AWAY IF:  Symptoms that don't begin to go away within 48 hours. Severe redness persists or worsens If the area turns color, spreads or swells. If it blisters and opens, develops yellow-brown crust or bleeds. You develop a fever or chills. If the pain increases or becomes unbearable.  Are unable to keep fluids and food down.  MAKE SURE YOU   Understand these instructions. Will watch your condition. Will get help right away if you are not doing well or get worse.  Thank you for choosing an e-visit.  Your e-visit answers were reviewed by a board certified advanced clinical practitioner to complete your personal care plan. Depending upon the condition, your plan could have included both over the counter or prescription medications.  Please review your pharmacy choice. Make sure the pharmacy is open so you can pick up prescription now. If there is a problem, you may contact your provider through MyChart messaging and have the prescription routed to another pharmacy.  Your safety is important to us. If you have drug allergies check your prescription carefully.   For the next 24 hours you can use MyChart to ask questions about today's visit, request a  non-urgent call back, or ask for a work or school excuse. You will get an email in the next two days asking about your experience. I hope that your e-visit has been valuable and will speed your recovery.  

## 2021-04-04 ENCOUNTER — Telehealth: Payer: BC Managed Care – PPO | Admitting: Nurse Practitioner

## 2021-04-04 DIAGNOSIS — J340 Abscess, furuncle and carbuncle of nose: Secondary | ICD-10-CM

## 2021-04-04 NOTE — Progress Notes (Signed)
E Visit for Cellulitis  We are sorry that you are not feeling well. Here is how we plan to help!  Based on what you shared with me it looks like you have cellulitis.  Cellulitis looks like areas of skin redness, swelling, and warmth; it develops as a result of bacteria entering under the skin. Little red spots and/or bleeding can be seen in skin, and tiny surface sacs containing fluid can occur. Fever can be present. Cellulitis is almost always on one side of a body, and the lower limbs are the most common site of involvement.   I have prescribed:  doxycycline 100mg  1 p bid FOR 10 DAYS  HOME CARE:  Take your medications as ordered and take all of them, even if the skin irritation appears to be healing.   GET HELP RIGHT AWAY IF:  Symptoms that don't begin to go away within 48 hours. Severe redness persists or worsens If the area turns color, spreads or swells. If it blisters and opens, develops yellow-brown crust or bleeds. You develop a fever or chills. If the pain increases or becomes unbearable.  Are unable to keep fluids and food down.  MAKE SURE YOU   Understand these instructions. Will watch your condition. Will get help right away if you are not doing well or get worse.  Thank you for choosing an e-visit.  Your e-visit answers were reviewed by a board certified advanced clinical practitioner to complete your personal care plan. Depending upon the condition, your plan could have included both over the counter or prescription medications.  Please review your pharmacy choice. Make sure the pharmacy is open so you can pick up prescription now. If there is a problem, you may contact your provider through and have the prescription routed to another pharmacy.  Your safety is important to [employer]. If you have drug allergies check your prescription carefully.   For the next 24 hours you can use MyChart to ask questions about today's visit, request a non-urgent call back,  or ask for a work or school excuse. You will get an email in the next two days asking about your experience. I hope that your e-visit has been valuable and will speed your recovery.   5-10 minutes spent reviewing and documenting in chart.

## 2021-04-05 MED ORDER — DOXYCYCLINE HYCLATE 100 MG PO TABS: 100.0000 mg | ORAL_TABLET | Freq: Two times a day (BID) | ORAL | 0 refills | Status: DC

## 2021-04-05 NOTE — Addendum Note (Signed)
Addended by: Jannifer Rodney A on: 04/05/2021 07:44 AM   Modules accepted: Orders

## 2021-05-20 ENCOUNTER — Telehealth: Payer: BC Managed Care – PPO | Admitting: Physician Assistant

## 2021-05-20 DIAGNOSIS — J208 Acute bronchitis due to other specified organisms: Secondary | ICD-10-CM

## 2021-05-20 DIAGNOSIS — B9689 Other specified bacterial agents as the cause of diseases classified elsewhere: Secondary | ICD-10-CM

## 2021-05-20 MED ORDER — AZITHROMYCIN 250 MG PO TABS: ORAL_TABLET | ORAL | 0 refills | Status: AC

## 2021-05-20 MED ORDER — BENZONATATE 100 MG PO CAPS: 100.0000 mg | ORAL_CAPSULE | Freq: Three times a day (TID) | ORAL | 0 refills | Status: AC | PRN

## 2021-05-20 NOTE — Progress Notes (Signed)
I have spent 5 minutes in review of e-visit questionnaire, review and updating patient chart, medical decision making and response to patient.   Rashawn Rayman Cody Degan Hanser, PA-C    

## 2021-05-20 NOTE — Progress Notes (Signed)

## 2021-10-04 ENCOUNTER — Telehealth: Payer: BC Managed Care – PPO | Admitting: Physician Assistant

## 2021-10-04 DIAGNOSIS — J340 Abscess, furuncle and carbuncle of nose: Secondary | ICD-10-CM

## 2021-10-04 MED ORDER — DOXYCYCLINE HYCLATE 100 MG PO TABS: 100.0000 mg | ORAL_TABLET | Freq: Two times a day (BID) | ORAL | 0 refills | Status: AC

## 2021-10-04 NOTE — Progress Notes (Signed)
I have spent 5 minutes in review of e-visit questionnaire, review and updating patient chart, medical decision making and response to patient.   Cheral Cappucci Cody Skyy Mcknight, PA-C    

## 2021-10-04 NOTE — Progress Notes (Signed)
E Visit for Cellulitis  We are sorry that you are not feeling well. Here is how we plan to help!  Based on what you shared with me it looks like you have cellulitis.  Cellulitis looks like areas of skin redness, swelling, and warmth; it develops as a result of bacteria entering under the skin. Little red spots and/or bleeding can be seen in skin, and tiny surface sacs containing fluid can occur. Fever can be present. Cellulitis is almost always on one side of a body, and the lower limbs are the most common site of involvement.   I have prescribed:  Doxycycline 100 mg twice daily for 7 days.   HOME CARE:  Take your medications as ordered and take all of them, even if the skin irritation appears to be healing.   GET HELP RIGHT AWAY IF:  Symptoms that don't begin to go away within 48 hours. Severe redness persists or worsens If the area turns color, spreads or swells. If it blisters and opens, develops yellow-brown crust or bleeds. You develop a fever or chills. If the pain increases or becomes unbearable.  Are unable to keep fluids and food down.  MAKE SURE YOU   Understand these instructions. Will watch your condition. Will get help right away if you are not doing well or get worse.  Thank you for choosing an e-visit.  Your e-visit answers were reviewed by a board certified advanced clinical practitioner to complete your personal care plan. Depending upon the condition, your plan could have included both over the counter or prescription medications.  Please review your pharmacy choice. Make sure the pharmacy is open so you can pick up prescription now. If there is a problem, you may contact your provider through [employer] and have the prescription routed to another pharmacy.  Your safety is important to [place]. If you have drug allergies check your prescription carefully.   For the next 24 hours you can use MyChart to ask questions about today's visit, request a non-urgent call  back, or ask for a work or school excuse. You will get an email in the next two days asking about your experience. I hope that your e-visit has been valuable and will speed your recovery.

## 2023-03-27 ENCOUNTER — Telehealth: Payer: Self-pay | Admitting: Family Medicine

## 2023-03-27 NOTE — Telephone Encounter (Signed)
Called patient let know that report is up at check in and she can get during normal business hours

## 2023-03-27 NOTE — Telephone Encounter (Signed)
Patient called in and is needing a copy of her immunization records. She would like a call when its ready. Thank you!
